# Patient Record
Sex: Female | Born: 1965 | Race: White | Hispanic: No | Marital: Married | Smoking: Never smoker
Health system: Southern US, Community
[De-identification: ages and names within clinical notes are randomized; demographics above are authoritative.]

## PROBLEM LIST (undated history)

## (undated) DIAGNOSIS — Z780 Asymptomatic menopausal state: Secondary | ICD-10-CM

## (undated) DIAGNOSIS — Z923 Personal history of irradiation: Secondary | ICD-10-CM

## (undated) DIAGNOSIS — Z9221 Personal history of antineoplastic chemotherapy: Secondary | ICD-10-CM

## (undated) DIAGNOSIS — Z95828 Presence of other vascular implants and grafts: Secondary | ICD-10-CM

## (undated) DIAGNOSIS — C50919 Malignant neoplasm of unspecified site of unspecified female breast: Secondary | ICD-10-CM

## (undated) DIAGNOSIS — I10 Essential (primary) hypertension: Secondary | ICD-10-CM

## (undated) DIAGNOSIS — G35 Multiple sclerosis: Secondary | ICD-10-CM

## (undated) HISTORY — DX: Asymptomatic menopausal state: Z78.0

## (undated) HISTORY — PX: BREAST BIOPSY: SHX20

---

## 1998-11-01 ENCOUNTER — Inpatient Hospital Stay (HOSPITAL_COMMUNITY): Admission: AD | Admit: 1998-11-01 | Discharge: 1998-11-01 | Payer: Self-pay | Admitting: *Deleted

## 1998-11-15 ENCOUNTER — Other Ambulatory Visit: Admission: RE | Admit: 1998-11-15 | Discharge: 1998-11-15 | Payer: Self-pay | Admitting: *Deleted

## 1999-03-11 ENCOUNTER — Ambulatory Visit (HOSPITAL_COMMUNITY): Admission: RE | Admit: 1999-03-11 | Discharge: 1999-03-11 | Payer: Self-pay | Admitting: Obstetrics and Gynecology

## 1999-03-11 ENCOUNTER — Encounter: Payer: Self-pay | Admitting: Obstetrics and Gynecology

## 1999-05-01 ENCOUNTER — Encounter: Payer: Self-pay | Admitting: Obstetrics and Gynecology

## 1999-05-01 ENCOUNTER — Inpatient Hospital Stay (HOSPITAL_COMMUNITY): Admission: AD | Admit: 1999-05-01 | Discharge: 1999-05-01 | Payer: Self-pay | Admitting: Obstetrics and Gynecology

## 1999-07-29 ENCOUNTER — Inpatient Hospital Stay (HOSPITAL_COMMUNITY): Admission: AD | Admit: 1999-07-29 | Discharge: 1999-07-31 | Payer: Self-pay | Admitting: *Deleted

## 1999-11-25 ENCOUNTER — Other Ambulatory Visit: Admission: RE | Admit: 1999-11-25 | Discharge: 1999-11-25 | Payer: Self-pay | Admitting: *Deleted

## 2005-02-05 ENCOUNTER — Ambulatory Visit: Payer: Self-pay | Admitting: Obstetrics and Gynecology

## 2006-04-16 ENCOUNTER — Ambulatory Visit: Payer: Self-pay | Admitting: Obstetrics and Gynecology

## 2006-11-17 ENCOUNTER — Emergency Department: Payer: Self-pay | Admitting: Emergency Medicine

## 2006-11-18 ENCOUNTER — Ambulatory Visit: Payer: Self-pay | Admitting: Unknown Physician Specialty

## 2007-01-31 ENCOUNTER — Emergency Department: Payer: Self-pay | Admitting: Emergency Medicine

## 2007-05-06 ENCOUNTER — Ambulatory Visit: Payer: Self-pay | Admitting: Obstetrics and Gynecology

## 2008-05-09 ENCOUNTER — Ambulatory Visit: Payer: Self-pay | Admitting: Obstetrics and Gynecology

## 2009-05-14 ENCOUNTER — Ambulatory Visit: Payer: Self-pay | Admitting: Obstetrics and Gynecology

## 2010-03-04 ENCOUNTER — Encounter: Payer: Self-pay | Admitting: Interventional Radiology

## 2010-05-31 ENCOUNTER — Ambulatory Visit: Payer: Self-pay | Admitting: Obstetrics and Gynecology

## 2011-06-12 DIAGNOSIS — G35 Multiple sclerosis: Secondary | ICD-10-CM | POA: Insufficient documentation

## 2011-06-12 DIAGNOSIS — I10 Essential (primary) hypertension: Secondary | ICD-10-CM | POA: Insufficient documentation

## 2011-06-13 DIAGNOSIS — M62838 Other muscle spasm: Secondary | ICD-10-CM | POA: Insufficient documentation

## 2011-06-18 ENCOUNTER — Ambulatory Visit: Payer: Self-pay | Admitting: Obstetrics and Gynecology

## 2011-09-11 DIAGNOSIS — R3915 Urgency of urination: Secondary | ICD-10-CM | POA: Insufficient documentation

## 2012-07-13 ENCOUNTER — Ambulatory Visit: Payer: Self-pay | Admitting: Obstetrics and Gynecology

## 2013-02-10 DIAGNOSIS — C50919 Malignant neoplasm of unspecified site of unspecified female breast: Secondary | ICD-10-CM

## 2013-02-10 HISTORY — DX: Malignant neoplasm of unspecified site of unspecified female breast: C50.919

## 2013-02-10 HISTORY — PX: BREAST LUMPECTOMY: SHX2

## 2013-02-17 DIAGNOSIS — R269 Unspecified abnormalities of gait and mobility: Secondary | ICD-10-CM | POA: Insufficient documentation

## 2013-09-15 ENCOUNTER — Ambulatory Visit: Payer: Self-pay | Admitting: Obstetrics and Gynecology

## 2013-09-23 ENCOUNTER — Ambulatory Visit: Payer: Self-pay | Admitting: Obstetrics and Gynecology

## 2013-09-28 ENCOUNTER — Ambulatory Visit: Payer: Self-pay | Admitting: Obstetrics and Gynecology

## 2013-10-04 LAB — PATHOLOGY REPORT

## 2013-10-11 ENCOUNTER — Ambulatory Visit: Payer: Self-pay | Admitting: Oncology

## 2013-10-18 ENCOUNTER — Ambulatory Visit: Payer: Self-pay | Admitting: Surgery

## 2013-10-18 LAB — CBC WITH DIFFERENTIAL/PLATELET
Basophil #: 0.1 10*3/uL (ref 0.0–0.1)
Basophil %: 1 %
Eosinophil #: 1 10*3/uL — ABNORMAL HIGH (ref 0.0–0.7)
Eosinophil %: 11 %
HCT: 38.3 % (ref 35.0–47.0)
HGB: 12.5 g/dL (ref 12.0–16.0)
Lymphocyte #: 2.2 10*3/uL (ref 1.0–3.6)
Lymphocyte %: 24.4 %
MCH: 28 pg (ref 26.0–34.0)
MCHC: 32.7 g/dL (ref 32.0–36.0)
MCV: 86 fL (ref 80–100)
Monocyte #: 0.6 x10 3/mm (ref 0.2–0.9)
Monocyte %: 7 %
Neutrophil #: 5.2 10*3/uL (ref 1.4–6.5)
Neutrophil %: 56.6 %
Platelet: 262 10*3/uL (ref 150–440)
RBC: 4.47 10*6/uL (ref 3.80–5.20)
RDW: 14.3 % (ref 11.5–14.5)
WBC: 9.1 10*3/uL (ref 3.6–11.0)

## 2013-10-21 ENCOUNTER — Ambulatory Visit: Payer: Self-pay | Admitting: Surgery

## 2013-10-21 LAB — HCG, QUANTITATIVE, PREGNANCY: Beta Hcg, Quant.: <1 m[IU]/mL — ABNORMAL LOW

## 2013-10-24 LAB — PATHOLOGY REPORT

## 2013-10-31 LAB — PREGNANCY, URINE: Pregnancy Test, Urine: NEGATIVE m[IU]/mL

## 2013-11-01 LAB — HCG, QUANTITATIVE, PREGNANCY: Beta Hcg, Quant.: <1 m[IU]/mL — ABNORMAL LOW

## 2013-11-10 ENCOUNTER — Ambulatory Visit: Payer: Self-pay | Admitting: Oncology

## 2013-11-11 ENCOUNTER — Ambulatory Visit: Payer: Self-pay | Admitting: Surgery

## 2013-11-11 LAB — HCG, QUANTITATIVE, PREGNANCY: Beta Hcg, Quant.: <1 m[IU]/mL — ABNORMAL LOW

## 2013-11-21 LAB — COMPREHENSIVE METABOLIC PANEL
ANION GAP: 10 (ref 7–16)
AST: 16 U/L (ref 15–37)
Albumin: 3.4 g/dL (ref 3.4–5.0)
Alkaline Phosphatase: 75 U/L
BILIRUBIN TOTAL: 0.2 mg/dL (ref 0.2–1.0)
BUN: 11 mg/dL (ref 7–18)
Calcium, Total: 8.5 mg/dL (ref 8.5–10.1)
Chloride: 103 mmol/L (ref 98–107)
Co2: 24 mmol/L (ref 21–32)
Creatinine: 0.94 mg/dL (ref 0.60–1.30)
EGFR (African American): >60
EGFR (Non-African Amer.): >60
Glucose: 146 mg/dL — ABNORMAL HIGH (ref 65–99)
Osmolality: 276 (ref 275–301)
Potassium: 3.5 mmol/L (ref 3.5–5.1)
SGPT (ALT): 18 U/L
SODIUM: 137 mmol/L (ref 136–145)
Total Protein: 6.7 g/dL (ref 6.4–8.2)

## 2013-11-21 LAB — CBC CANCER CENTER
Basophil #: 0.1 x10 3/mm (ref 0.0–0.1)
Basophil %: 1.2 %
Eosinophil #: 1.6 x10 3/mm — ABNORMAL HIGH (ref 0.0–0.7)
Eosinophil %: 15 %
HCT: 36.2 % (ref 35.0–47.0)
HGB: 12 g/dL (ref 12.0–16.0)
Lymphocyte #: 3 x10 3/mm (ref 1.0–3.6)
Lymphocyte %: 27.7 %
MCH: 27.9 pg (ref 26.0–34.0)
MCHC: 33.1 g/dL (ref 32.0–36.0)
MCV: 84 fL (ref 80–100)
Monocyte #: 0.8 x10 3/mm (ref 0.2–0.9)
Monocyte %: 7.3 %
Neutrophil #: 5.3 x10 3/mm (ref 1.4–6.5)
Neutrophil %: 48.8 %
Platelet: 266 x10 3/mm (ref 150–440)
RBC: 4.29 10*6/uL (ref 3.80–5.20)
RDW: 13.8 % (ref 11.5–14.5)
WBC: 10.9 x10 3/mm (ref 3.6–11.0)

## 2013-11-28 LAB — COMPREHENSIVE METABOLIC PANEL
ALBUMIN: 3.5 g/dL (ref 3.4–5.0)
ALK PHOS: 100 U/L
Anion Gap: 9 (ref 7–16)
BUN: 12 mg/dL (ref 7–18)
Bilirubin,Total: 0.2 mg/dL (ref 0.2–1.0)
CALCIUM: 9 mg/dL (ref 8.5–10.1)
CO2: 27 mmol/L (ref 21–32)
Chloride: 102 mmol/L (ref 98–107)
Creatinine: 0.71 mg/dL (ref 0.60–1.30)
EGFR (African American): >60
EGFR (Non-African Amer.): >60
GLUCOSE: 98 mg/dL (ref 65–99)
Osmolality: 275 (ref 275–301)
Potassium: 3.8 mmol/L (ref 3.5–5.1)
SGOT(AST): 14 U/L — ABNORMAL LOW (ref 15–37)
SGPT (ALT): 18 U/L
SODIUM: 138 mmol/L (ref 136–145)
Total Protein: 7.3 g/dL (ref 6.4–8.2)

## 2013-11-28 LAB — CBC CANCER CENTER
Basophil #: 0.1 x10 3/mm (ref 0.0–0.1)
Basophil %: 1.8 %
EOS PCT: 10.8 %
Eosinophil #: 0.4 x10 3/mm (ref 0.0–0.7)
HCT: 36.3 % (ref 35.0–47.0)
HGB: 11.8 g/dL — AB (ref 12.0–16.0)
LYMPHS PCT: 32.5 %
Lymphocyte #: 1.3 x10 3/mm (ref 1.0–3.6)
MCH: 27.3 pg (ref 26.0–34.0)
MCHC: 32.4 g/dL (ref 32.0–36.0)
MCV: 85 fL (ref 80–100)
MONOS PCT: 3.6 %
Monocyte #: 0.1 x10 3/mm — ABNORMAL LOW (ref 0.2–0.9)
NEUTROS PCT: 51.3 %
Neutrophil #: 2 x10 3/mm (ref 1.4–6.5)
Platelet: 235 x10 3/mm (ref 150–440)
RBC: 4.3 10*6/uL (ref 3.80–5.20)
RDW: 14 % (ref 11.5–14.5)
WBC: 4 x10 3/mm (ref 3.6–11.0)

## 2013-12-05 LAB — COMPREHENSIVE METABOLIC PANEL
ALK PHOS: 115 U/L
ALT: 33 U/L
Albumin: 3.9 g/dL (ref 3.4–5.0)
Anion Gap: 10 (ref 7–16)
BUN: 13 mg/dL (ref 7–18)
Bilirubin,Total: 0.2 mg/dL (ref 0.2–1.0)
CALCIUM: 8.8 mg/dL (ref 8.5–10.1)
CO2: 26 mmol/L (ref 21–32)
Chloride: 101 mmol/L (ref 98–107)
Creatinine: 0.96 mg/dL (ref 0.60–1.30)
EGFR (African American): >60
GLUCOSE: 120 mg/dL — AB (ref 65–99)
Osmolality: 275 (ref 275–301)
POTASSIUM: 4 mmol/L (ref 3.5–5.1)
SGOT(AST): 27 U/L (ref 15–37)
Sodium: 137 mmol/L (ref 136–145)
TOTAL PROTEIN: 7.4 g/dL (ref 6.4–8.2)

## 2013-12-05 LAB — CBC CANCER CENTER
Basophil #: 0.2 x10 3/mm — ABNORMAL HIGH (ref 0.0–0.1)
Basophil %: 1 %
EOS ABS: 0.3 x10 3/mm (ref 0.0–0.7)
EOS PCT: 2.1 %
HCT: 36.2 % (ref 35.0–47.0)
HGB: 11.9 g/dL — ABNORMAL LOW (ref 12.0–16.0)
LYMPHS ABS: 2.2 x10 3/mm (ref 1.0–3.6)
LYMPHS PCT: 13.8 %
MCH: 27.6 pg (ref 26.0–34.0)
MCHC: 32.9 g/dL (ref 32.0–36.0)
MCV: 84 fL (ref 80–100)
Monocyte #: 1.4 x10 3/mm — ABNORMAL HIGH (ref 0.2–0.9)
Monocyte %: 8.4 %
Neutrophil #: 12.2 x10 3/mm — ABNORMAL HIGH (ref 1.4–6.5)
Neutrophil %: 74.7 %
Platelet: 288 x10 3/mm (ref 150–440)
RBC: 4.3 10*6/uL (ref 3.80–5.20)
RDW: 14.1 % (ref 11.5–14.5)
WBC: 16.3 x10 3/mm — ABNORMAL HIGH (ref 3.6–11.0)

## 2013-12-11 ENCOUNTER — Ambulatory Visit: Payer: Self-pay | Admitting: Oncology

## 2013-12-19 LAB — CBC CANCER CENTER
Basophil #: 0.2 x10 3/mm — ABNORMAL HIGH (ref 0.0–0.1)
Basophil %: 1.3 %
EOS PCT: 2.4 %
Eosinophil #: 0.4 x10 3/mm (ref 0.0–0.7)
HCT: 35.3 % (ref 35.0–47.0)
HGB: 11.6 g/dL — ABNORMAL LOW (ref 12.0–16.0)
LYMPHS PCT: 13.4 %
Lymphocyte #: 2.1 x10 3/mm (ref 1.0–3.6)
MCH: 27.9 pg (ref 26.0–34.0)
MCHC: 32.9 g/dL (ref 32.0–36.0)
MCV: 85 fL (ref 80–100)
MONO ABS: 1.6 x10 3/mm — AB (ref 0.2–0.9)
MONOS PCT: 9.8 %
NEUTROS ABS: 11.7 x10 3/mm — AB (ref 1.4–6.5)
Neutrophil %: 73.1 %
Platelet: 216 x10 3/mm (ref 150–440)
RBC: 4.16 10*6/uL (ref 3.80–5.20)
RDW: 14.4 % (ref 11.5–14.5)
WBC: 16 x10 3/mm — AB (ref 3.6–11.0)

## 2013-12-19 LAB — COMPREHENSIVE METABOLIC PANEL
ALT: 22 U/L
ANION GAP: 12 (ref 7–16)
Albumin: 3.8 g/dL (ref 3.4–5.0)
Alkaline Phosphatase: 102 U/L
BUN: 9 mg/dL (ref 7–18)
Bilirubin,Total: 0.2 mg/dL (ref 0.2–1.0)
CREATININE: 0.97 mg/dL (ref 0.60–1.30)
Calcium, Total: 8.9 mg/dL (ref 8.5–10.1)
Chloride: 102 mmol/L (ref 98–107)
Co2: 26 mmol/L (ref 21–32)
EGFR (Non-African Amer.): >60
Glucose: 105 mg/dL — ABNORMAL HIGH (ref 65–99)
OSMOLALITY: 278 (ref 275–301)
Potassium: 3.8 mmol/L (ref 3.5–5.1)
SGOT(AST): 17 U/L (ref 15–37)
Sodium: 140 mmol/L (ref 136–145)
TOTAL PROTEIN: 7.2 g/dL (ref 6.4–8.2)

## 2014-01-02 LAB — COMPREHENSIVE METABOLIC PANEL
ALT: 32 U/L
Albumin: 3.6 g/dL (ref 3.4–5.0)
Alkaline Phosphatase: 113 U/L
Anion Gap: 12 (ref 7–16)
BUN: 12 mg/dL (ref 7–18)
Bilirubin,Total: 0.2 mg/dL (ref 0.2–1.0)
CO2: 25 mmol/L (ref 21–32)
Calcium, Total: 8.7 mg/dL (ref 8.5–10.1)
Chloride: 101 mmol/L (ref 98–107)
Creatinine: 0.85 mg/dL (ref 0.60–1.30)
Glucose: 134 mg/dL — ABNORMAL HIGH (ref 65–99)
Osmolality: 277 (ref 275–301)
Potassium: 3.3 mmol/L — ABNORMAL LOW (ref 3.5–5.1)
SGOT(AST): 24 U/L (ref 15–37)
Sodium: 138 mmol/L (ref 136–145)
Total Protein: 6.8 g/dL (ref 6.4–8.2)

## 2014-01-02 LAB — CBC CANCER CENTER
Bands: 6 %
Basophil: 1 %
EOS PCT: 1 %
HCT: 33.2 % — ABNORMAL LOW (ref 35.0–47.0)
HGB: 10.8 g/dL — ABNORMAL LOW (ref 12.0–16.0)
Lymphocytes: 9 %
MCH: 27.9 pg (ref 26.0–34.0)
MCHC: 32.6 g/dL (ref 32.0–36.0)
MCV: 86 fL (ref 80–100)
MONOS PCT: 8 %
Metamyelocyte: 4 %
Myelocyte: 6 %
NRBC/100 WBC: 5 /100
Platelet: 253 x10 3/mm (ref 150–440)
RBC: 3.88 10*6/uL (ref 3.80–5.20)
RDW: 16.7 % — ABNORMAL HIGH (ref 11.5–14.5)
Segmented Neutrophils: 65 %
WBC: 14.2 x10 3/mm — ABNORMAL HIGH (ref 3.6–11.0)

## 2014-01-10 ENCOUNTER — Ambulatory Visit: Payer: Self-pay | Admitting: Oncology

## 2014-01-16 LAB — CBC CANCER CENTER
Basophil #: 0.1 x10 3/mm (ref 0.0–0.1)
Basophil %: 0.8 %
EOS ABS: 0.1 x10 3/mm (ref 0.0–0.7)
Eosinophil %: 0.6 %
HCT: 30.5 % — ABNORMAL LOW (ref 35.0–47.0)
HGB: 10.3 g/dL — ABNORMAL LOW (ref 12.0–16.0)
LYMPHS ABS: 1.4 x10 3/mm (ref 1.0–3.6)
Lymphocyte %: 10.4 %
MCH: 28.7 pg (ref 26.0–34.0)
MCHC: 33.7 g/dL (ref 32.0–36.0)
MCV: 85 fL (ref 80–100)
MONO ABS: 1.3 x10 3/mm — AB (ref 0.2–0.9)
MONOS PCT: 9.3 %
NEUTROS ABS: 10.8 x10 3/mm — AB (ref 1.4–6.5)
Neutrophil %: 78.9 %
PLATELETS: 190 x10 3/mm (ref 150–440)
RBC: 3.58 10*6/uL — ABNORMAL LOW (ref 3.80–5.20)
RDW: 20.5 % — AB (ref 11.5–14.5)
WBC: 13.7 x10 3/mm — AB (ref 3.6–11.0)

## 2014-01-16 LAB — COMPREHENSIVE METABOLIC PANEL
ALK PHOS: 110 U/L
ANION GAP: 11 (ref 7–16)
Albumin: 3.6 g/dL (ref 3.4–5.0)
BUN: 10 mg/dL (ref 7–18)
Bilirubin,Total: 0.3 mg/dL (ref 0.2–1.0)
CALCIUM: 8.8 mg/dL (ref 8.5–10.1)
CHLORIDE: 102 mmol/L (ref 98–107)
Co2: 25 mmol/L (ref 21–32)
Creatinine: 0.86 mg/dL (ref 0.60–1.30)
EGFR (Non-African Amer.): >60
Glucose: 111 mg/dL — ABNORMAL HIGH (ref 65–99)
Osmolality: 275 (ref 275–301)
POTASSIUM: 3.3 mmol/L — AB (ref 3.5–5.1)
SGOT(AST): 14 U/L — ABNORMAL LOW (ref 15–37)
SGPT (ALT): 17 U/L
SODIUM: 138 mmol/L (ref 136–145)
Total Protein: 7.1 g/dL (ref 6.4–8.2)

## 2014-01-23 LAB — COMPREHENSIVE METABOLIC PANEL
ALK PHOS: 90 U/L
ALT: 18 U/L
ANION GAP: 9 (ref 7–16)
Albumin: 3.5 g/dL (ref 3.4–5.0)
BUN: 9 mg/dL (ref 7–18)
Bilirubin,Total: 0.3 mg/dL (ref 0.2–1.0)
CREATININE: 0.8 mg/dL (ref 0.60–1.30)
Calcium, Total: 8.8 mg/dL (ref 8.5–10.1)
Chloride: 104 mmol/L (ref 98–107)
Co2: 25 mmol/L (ref 21–32)
EGFR (African American): >60
EGFR (Non-African Amer.): >60
Glucose: 97 mg/dL (ref 65–99)
Osmolality: 274 (ref 275–301)
Potassium: 3.5 mmol/L (ref 3.5–5.1)
SGOT(AST): 13 U/L — ABNORMAL LOW (ref 15–37)
Sodium: 138 mmol/L (ref 136–145)
TOTAL PROTEIN: 7 g/dL (ref 6.4–8.2)

## 2014-01-23 LAB — CBC CANCER CENTER
BASOS PCT: 1.3 %
Basophil #: 0.1 x10 3/mm (ref 0.0–0.1)
EOS ABS: 0.1 x10 3/mm (ref 0.0–0.7)
Eosinophil %: 1.5 %
HCT: 29 % — ABNORMAL LOW (ref 35.0–47.0)
HGB: 9.7 g/dL — ABNORMAL LOW (ref 12.0–16.0)
LYMPHS ABS: 0.8 x10 3/mm — AB (ref 1.0–3.6)
Lymphocyte %: 9.8 %
MCH: 29.6 pg (ref 26.0–34.0)
MCHC: 33.4 g/dL (ref 32.0–36.0)
MCV: 89 fL (ref 80–100)
Monocyte #: 0.8 x10 3/mm (ref 0.2–0.9)
Monocyte %: 10.1 %
NEUTROS PCT: 77.3 %
Neutrophil #: 6.2 x10 3/mm (ref 1.4–6.5)
PLATELETS: 300 x10 3/mm (ref 150–440)
RBC: 3.27 10*6/uL — AB (ref 3.80–5.20)
RDW: 22.1 % — ABNORMAL HIGH (ref 11.5–14.5)
WBC: 8 x10 3/mm (ref 3.6–11.0)

## 2014-01-30 LAB — CBC CANCER CENTER
BASOS ABS: 0.2 x10 3/mm — AB (ref 0.0–0.1)
BASOS PCT: 2.6 %
EOS ABS: 0.3 x10 3/mm (ref 0.0–0.7)
Eosinophil %: 4.7 %
HCT: 31.5 % — AB (ref 35.0–47.0)
HGB: 10.7 g/dL — AB (ref 12.0–16.0)
LYMPHS ABS: 0.8 x10 3/mm — AB (ref 1.0–3.6)
LYMPHS PCT: 11.6 %
MCH: 29.7 pg (ref 26.0–34.0)
MCHC: 33.8 g/dL (ref 32.0–36.0)
MCV: 88 fL (ref 80–100)
Monocyte #: 0.7 x10 3/mm (ref 0.2–0.9)
Monocyte %: 9.9 %
NEUTROS PCT: 71.2 %
Neutrophil #: 5.2 x10 3/mm (ref 1.4–6.5)
Platelet: 316 x10 3/mm (ref 150–440)
RBC: 3.59 10*6/uL — ABNORMAL LOW (ref 3.80–5.20)
RDW: 22.2 % — ABNORMAL HIGH (ref 11.5–14.5)
WBC: 7.3 x10 3/mm (ref 3.6–11.0)

## 2014-01-30 LAB — COMPREHENSIVE METABOLIC PANEL
ALBUMIN: 3.6 g/dL (ref 3.4–5.0)
ALK PHOS: 109 U/L
ALT: 21 U/L
AST: 13 U/L — AB (ref 15–37)
Anion Gap: 9 (ref 7–16)
BUN: 12 mg/dL (ref 7–18)
Bilirubin,Total: 0.4 mg/dL (ref 0.2–1.0)
CHLORIDE: 102 mmol/L (ref 98–107)
CO2: 27 mmol/L (ref 21–32)
Calcium, Total: 8.6 mg/dL (ref 8.5–10.1)
Creatinine: 0.88 mg/dL (ref 0.60–1.30)
EGFR (Non-African Amer.): >60
GLUCOSE: 117 mg/dL — AB (ref 65–99)
Osmolality: 276 (ref 275–301)
POTASSIUM: 3.6 mmol/L (ref 3.5–5.1)
SODIUM: 138 mmol/L (ref 136–145)
TOTAL PROTEIN: 7.1 g/dL (ref 6.4–8.2)

## 2014-02-10 ENCOUNTER — Ambulatory Visit: Payer: Self-pay | Admitting: Oncology

## 2014-02-13 LAB — CBC CANCER CENTER
Basophil #: 0.1 x10 3/mm (ref 0.0–0.1)
Basophil %: 0.8 %
EOS ABS: 0.3 x10 3/mm (ref 0.0–0.7)
EOS PCT: 3.2 %
HCT: 33.9 % — ABNORMAL LOW (ref 35.0–47.0)
HGB: 11.4 g/dL — ABNORMAL LOW (ref 12.0–16.0)
Lymphocyte #: 0.8 x10 3/mm — ABNORMAL LOW (ref 1.0–3.6)
Lymphocyte %: 10.3 %
MCH: 30.2 pg (ref 26.0–34.0)
MCHC: 33.7 g/dL (ref 32.0–36.0)
MCV: 90 fL (ref 80–100)
MONOS PCT: 12.2 %
Monocyte #: 1 x10 3/mm — ABNORMAL HIGH (ref 0.2–0.9)
NEUTROS PCT: 73.5 %
Neutrophil #: 6 x10 3/mm (ref 1.4–6.5)
PLATELETS: 320 x10 3/mm (ref 150–440)
RBC: 3.78 10*6/uL — AB (ref 3.80–5.20)
RDW: 20.1 % — ABNORMAL HIGH (ref 11.5–14.5)
WBC: 8.1 x10 3/mm (ref 3.6–11.0)

## 2014-02-13 LAB — COMPREHENSIVE METABOLIC PANEL
ALBUMIN: 3.4 g/dL (ref 3.4–5.0)
ALK PHOS: 130 U/L — AB
ALT: 24 U/L
AST: 24 U/L (ref 15–37)
Anion Gap: 9 (ref 7–16)
BUN: 9 mg/dL (ref 7–18)
Bilirubin,Total: 0.4 mg/dL (ref 0.2–1.0)
CALCIUM: 8.8 mg/dL (ref 8.5–10.1)
Chloride: 102 mmol/L (ref 98–107)
Co2: 27 mmol/L (ref 21–32)
Creatinine: 0.73 mg/dL (ref 0.60–1.30)
EGFR (African American): >60
EGFR (Non-African Amer.): >60
GLUCOSE: 133 mg/dL — AB (ref 65–99)
Osmolality: 276 (ref 275–301)
Potassium: 3.3 mmol/L — ABNORMAL LOW (ref 3.5–5.1)
Sodium: 138 mmol/L (ref 136–145)
TOTAL PROTEIN: 7.2 g/dL (ref 6.4–8.2)

## 2014-02-16 DIAGNOSIS — C50919 Malignant neoplasm of unspecified site of unspecified female breast: Secondary | ICD-10-CM | POA: Insufficient documentation

## 2014-02-16 DIAGNOSIS — C50412 Malignant neoplasm of upper-outer quadrant of left female breast: Secondary | ICD-10-CM | POA: Insufficient documentation

## 2014-03-13 ENCOUNTER — Ambulatory Visit: Payer: Self-pay | Admitting: Oncology

## 2014-04-11 ENCOUNTER — Ambulatory Visit
Admit: 2014-04-11 | Disposition: A | Discharge status: Home / Self Care | Payer: Self-pay | Attending: Oncology | Admitting: Oncology

## 2014-05-12 ENCOUNTER — Ambulatory Visit
Admit: 2014-05-12 | Disposition: A | Discharge status: Home / Self Care | Payer: Self-pay | Attending: Oncology | Admitting: Oncology

## 2014-06-03 NOTE — Op Note (Signed)
PATIENT NAME:  Rachel Hall, Rachel Hall MR#:  [id number] DATE OF BIRTH:  Jul 15, 1965  DATE OF PROCEDURE:  11/11/2013  PREOPERATIVE DIAGNOSIS: Breast cancer.   POSTOPERATIVE DIAGNOSIS: Breast cancer.   PROCEDURE PERFORMED: Insertion central venous catheter with subcutaneous infusion port.   SURGEON:  Loreli Dollar, MD   ANESTHESIA: Monitored anesthesia care and local 1% Xylocaine.   INDICATIONS: This 49 year old female recently had findings of left breast cancer, now needing central venous access for chemotherapy.   DESCRIPTION OF PROCEDURE: The patient was placed on the operating table in the supine position under intravenous sedation and monitored by the anesthesia staff. A rolled sheet was placed behind the shoulder blades so that the neck was extended and the head turned 20 degrees to the left. The anterior aspect of her neck and the right subclavian areas were prepared with ChloraPrep and draped in a sterile manner.   The skin beneath the clavicle was infiltrated with 1% Xylocaine. A transversely oriented 3 cm incision was made, carried down through subcutaneous tissues. A subcutaneous pouch was created inferior to the incision large enough to admit the [place] port. Several small bleeding points were cauterized.   The jugular vein was identified with ultrasound, also identified the carotid artery. The skin overlying the jugular vein was infiltrated with 1% Xylocaine. A transversely oriented 5 mm incision was made.  The needle was inserted into the jugular vein using ultrasound guidance and ultrasound image was saved for the paper chart. The guidewire was advanced down through the needle into the central circulation, and the needle removed. Next, fluoroscopy was used to demonstrate the position of the guidewire in the vena cava. Next, the dilator and introducer sheath were advanced over the guidewire. The guidewire and dilator were removed. The catheter was advanced down through the sheath, and the  sheath was peeled away. The tip of the catheter was positioned in the superior vena cava as demonstrated with fluoroscopy. A fluoroscopic image was saved for the paper chart. The catheter was tunneled to the subclavian incision. It was cut to fit and attached to the [place] port. This was secured with the accompanying sleeve. Next, the port was placed into the subcutaneous pouch and sutured to the deep fascia with 4-0 silk. The port was accessed with a Huber needle and aspirated a trace of blood and flushed with heparinized saline solution.   Next, the subcutaneous tissues were closed with 5-0 Vicryl. Skin incisions were closed with 5-0 Vicryl subcuticular suture and Dermabond. The patient tolerated surgery satisfactorily and was prepared for transfer to the recovery room.   ____________________________ Lenna Sciara. Rochel Brome, MD jws:LT D: 11/22/2013 15:17:04 ET T: 11/22/2013 16:32:59 ET JOB#: [id number]  cc: Loreli Dollar, MD, <Dictator> Loreli Dollar MD ELECTRONICALLY SIGNED 11/22/2013 18:57

## 2014-06-03 NOTE — Op Note (Signed)
PATIENT NAME:  Rachel Hall, Rachel Hall MR#:  [id number] DATE OF BIRTH:  22-Apr-1965  DATE OF PROCEDURE:  10/21/2013  PREOPERATIVE DIAGNOSIS: Carcinoma of the left breast.   POSTOPERATIVE DIAGNOSIS: Carcinoma of the left breast.   PROCEDURE: Left partial mastectomy with sentinel lymph node biopsy.   SURGEON: Rochel Brome, MD.   ANESTHESIA: General.   INDICATIONS: This 49 year old female recently had a mammogram depicting a distortion in the upper outer quadrant of the left breast. Ultrasound demonstrated a 1.2 cm irregular mass. Biopsy demonstrated invasive mammary carcinoma. She did have a palpable mass at the 2 o'clock position of the left breast. The patient did have preoperative injection of radioactive technetium sulfur colloid.   DESCRIPTION OF PROCEDURE: The patient was placed on the operating table in the supine position under general endotracheal anesthesia. The left arm was placed on a lateral arm support. The left breast, axilla, upper arm and chest wall were prepared with ChloraPrep and draped in a sterile manner. Ultrasound was used to demonstrate the location of an approximately 1 cm mass at approximately the 2 o'clock position of the left breast, some 3.5 cm from the nipple. There was also palpable firmness at this site. A curvilinear incision was made from the 11:30 position to the 3:30 position. I did remove an ellipse of skin, which was approximately 1 cm wide directly over the mass. The dissection was carried down through subcutaneous tissues, down to palpate the mass and dissection was continued as palpation helped to direct the course of the dissection. The mass was oriented so that the 3:30 end of the skin ellipse was tagged with a 3-0 nylon stitch. Also, margin maps were used to suture to the specimen to mark the mediolateral, craniocaudal and deep margins. This was submitted for pathology. The wound was inspected. Several small bleeding points were cauterized. Hemostasis subsequently  appeared to be intact. There was no remaining mass seen within the wound.   Next, attention was turned to the axilla. The gamma counter was used to demonstrate the location of radioactivity in the inferior aspect of the left axilla. An obliquely oriented 4 cm incision was made, which was lengthened to nearly 5 cm and carried down through subcutaneous tissues, through superficial fascia deeply within the axilla and encountered an area of radioactivity. A portion of fatty tissue containing a lymph node was dissected free from surrounding structures using electrocautery. The ex vivo count was in the range of 40 to 55 counts per second. This was submitted as sentinel lymph node #1. Background count was minimal. There were 2 palpable lymph nodes within the axilla. There was a minimal degree of radioactivity at these sites, but they were firm, approximately 5 mm and were resected with some surrounding fatty tissue. The ex vivo counts were minimal. This was submitted as additional axillary lymph nodes. There was no other palpable mass within the axilla. It is noted that during the course of the axillary dissection, 1 vessel was suture ligated with 4-0 chromic. Hemostasis otherwise appeared to be intact. The pathologist did call back to say that the margins appeared to be clear with the left partial mastectomy specimen, with the pathologist having found the tumor and seen that the closest margin was deep margin and was clearly free of tumor and has been submitted for routine studies.   The axillary wound was further inspected. Hemostasis was intact. Subcutaneous tissues were approximated with 4-0 chromic. Both wounds were treated with subcutaneous 0.5% Sensorcaine with epinephrine. Next, the skin of  the partial mastectomy wound was approximated with running 4-0 Monocryl subcuticular suture. The axillary wound was further inspected and subcutaneous tissues were approximated with 4-0 chromic. The skin was closed with  running 4-0 Monocryl subcuticular suture. Both wounds were treated with Dermabond. The patient tolerated surgery satisfactorily and was then prepared for transfer to the recovery room.     ____________________________ Lenna Sciara. Rochel Brome, MD jws:TT D: 10/21/2013 14:46:36 ET T: 10/21/2013 15:06:25 ET JOB#: [id number]  cc: Loreli Dollar, MD, <Dictator> Loreli Dollar MD ELECTRONICALLY SIGNED 10/21/2013 17:27

## 2014-06-11 NOTE — Consult Note (Signed)
Reason for Visit: This 49 year old Female patient presents to the clinic for initial evaluation of  reast cancer .   Referred by Dr. Grayland Ormond.  Diagnosis:  Chief Complaint/Diagnosis   49 year old female with multiple sclerosis stage IIa (T1c and 18 M0) invasive mammary carcinoma of the left breast status post wide local excision adjuvant chemotherapy with Cytoxan Adriamycin and 4 cycles of Taxol now for left breast and perform hepatic radiation.  Pathology Report pathology report reviewed   Imaging Report mammograms reviewed   Referral Report clinical notes reviewed   Planned Treatment Regimen adjuvant whole breast and peripheral lymphatic radiation   HPI   patient is a 49 year old female with known multiple sclerosis who presented with an abnormal mammogram of her left breast showing a mass at the 1:00 position measuring approximately 1.1 x 1.2 cm in the upper outer quadrant of the left breast suspicious for malignancy. On physical examination a firm palpable mass at that location was identified. She underwent biopsy which was positive for invasive mammary carcinoma.Jodelle Red wide local excision and sentinel node biopsy. Tumor was grade 1 overall 1.6 cm invasive mammary carcinoma.margins were clear but close to 1.3 mm for the invasive component and 2 mm for DCIS component. 4 lymph nodes were removed one had a macro metastatic focus of metastatic disease. Tumor was ER/PR positive HER-2/neu negative. She underwent 4 cycles of Cytoxan H Romycin. She was started on weekly Taxol after 4 cycles had worsening of her symptoms of multiple sclerosis it was decided to stop any further chemotherapy and go to radiation at this point. She still fairly weak and fatigued. She is wheelchair bound at this point. She specifically denies any breast tenderness cough or bone pain.  Past Hx:    multiple sclerosis:   Past, Family and Social History:  Past Medical History positive   Cardiovascular  hyperlipidemia; hypertension   Neurological/Psychiatric multiple sclerosis   Past Medical History Comments chronic pain syndrome   Family History noncontributory   Social History noncontributory   Additional Past Medical and Surgical History accompanied by her mom today   Allergies:   No Known Allergies:   Home Meds:  Home Medications: Medication Instructions Status  ALPRAZolam 0.25 mg oral tablet 1 tab(s) orally once a day (at bedtime) Active  Compazine tablet 10 mg 1 tab(s) orally every 6 hours x 30 days as needed   Active  Tylenol Extra Strength 1 cap(s) orally every 4 to 6 hours, As Needed - for Pain Active  aspirin 81 mg oral tablet 1 tab(s) orally once a day Active  Norco 325 mg-5 mg oral tablet 1-2 tablets orally every 4 hours  -for Pain as needed Active  medicated mouthwash 5 milliliter(s)  4 times a day swish and swallow Active  MiraLax - oral powder for reconstitution 2 -3 capfuls dose(s) orally once a day Active  FLUoxetine 20 mg oral capsule 3 cap(s) orally once a day Active  amLODIPine 5 mg oral tablet 1 tab(s) orally once a day Active  omeprazole 20 mg oral delayed release capsule 1 cap(s) orally once a day Active  oxybutynin 5 mg oral tablet 1 tab(s) orally 2 times a day Active  diphenhydrAMINE 25 mg oral capsule 1 cap(s) orally once a day Active  Melatonin 1 mg oral tablet 1 tab(s) orally once a day (at bedtime) Active  Fleet Glycerin Suppositories Adult adult rectal suppository 1 suppository(ies) rectal once a day, As Needed Active   Review of Systems:  General negative   Performance  Status (ECOG) 1   Skin negative   Breast see HPI   Ophthalmologic negative   ENMT negative   Respiratory and Thorax negative   Cardiovascular negative   Gastrointestinal negative   Genitourinary negative   Musculoskeletal negative   Neurological see HPI   Psychiatric negative   Hematology/Lymphatics negative   Endocrine negative   Allergic/Immunologic  negative   Nursing Notes:  Nursing Vital Signs and Chemo Nursing Nursing Notes: *CC Vital Signs Flowsheet:   21-Jan-16 10:01  Temp Temperature 97.7  Pulse Pulse 109  Respirations Respirations 18  SBP SBP 130  DBP DBP 77  Pain Scale (0-10)  0  Height (cm) centimeters 156    10:56  Height (cm) centimeters 156   Physical Exam:  General/Skin/HEENT:  Skin normal   Eyes normal   ENMT normal   Head and Neck normal   Additional PE well-developed female wheelchair-bound in NAD. Status post wide local excision of the left breast. No dominant mass or nodularity is noted in either breast in 2 positions examined. Lungs are clear to A&P cardiac examination shows regular rate and rhythm. Abdomen is benign. No evidence of lymphedema in her left upper extremity is noted. She has obvious sequela of multiple sclerosis.   Breasts/Resp/CV/GI/GU:  Respiratory and Thorax normal   Cardiovascular normal   Gastrointestinal normal   Genitourinary normal   MS/Neuro/Psych/Lymph:  Musculoskeletal normal   Neurological normal   Lymphatics normal   Other Results:  Radiology Results: LabUnknown:    14-Aug-15 12:11, Digital Additional Views Lt Breast [facility])  PACS Image   [facility]:  Digital Additional Views Lt Breast (SCR)   REASON FOR EXAM:    LT BREAST AV FOR DISTORTION  COMMENTS:       PROCEDURE: MAM - MAM DGTL ADD VW LT  SCR  - Sep 23 2013 12:11PM     CLINICAL DATA:  49 year old female with possible left breast  distortion on screening mammogram.    EXAM:  DIGITAL DIAGNOSTIC  LEFT MAMMOGRAM    ULTRASOUND LEFT BREAST    COMPARISON:  09/15/2013 and prior mammograms dating back to  02/05/2005  ACR Breast Density Category c: The breast tissue is heterogeneously  dense, which may obscure small masses.    FINDINGS:  Spot compression views of the left breast demonstrate an area of  persistent distortion within the upper outer left breast.    On physical exam, a firm palpable  mass identified at the 1 o'clock  position of the left breast 3 cm from the nipple.    Ultrasound is performed, showing a 1.1 x 1.2 x 0.9 cm irregular  hypoechoic mass at the 1 o'clock position of the left breast 3 cm  from the nipple. No enlarged or abnormal appearing left axillary  lymph nodes are identified.   IMPRESSION:  1.1 x 1.2 x 0.9cm irregular mass in the upper outer left breast  suspicious for malignancy. Tissue sampling is recommended.    No abnormal appearing left axillary lymph nodes identified.    RECOMMENDATION:  Ultrasound-guided left breast biopsy.    I have discussed the findings and recommendations with the patient.  Results were also provided in writing at the conclusion of the  visit. If applicable, a reminder letter will be sent to the patient  regarding the next appointment.    BI-RADS CATEGORY  4: Suspicious.  Electronically Signed    By: Hassan Rowan M.D.    On: 09/23/2013 12:35  Verified By: Lura Em, M.D.,   Relevent Results:   Relevant Scans and Labs mammograms are reviewed   Assessment and Plan: Impression:   stage II a invasive mammary carcinoma left breast status post wide local excision and sentinel node biopsy with adjuvant chemotherapy in patient with known multiple sclerosis. Plan:   at this time I have recommended whole breast radiation. Also recommended peripheral lymphatic radiation based on the limited lymph node dissection in 1 of 4 lymph nodes being positive for macro metastatic disease. I would plan on delivering 5000 cGy to both her breast and peripheral lymphatics boosting her scar another 1600 cGy based on the close margins. Risks and benefits of treatment including skin reaction, fatigue, alteration of blood counts, occlusion of superficial lung, and possibility of lymphedema of her left upper extremity all were discussed in detail. Even though she's had a limited lymph node dissection I have recommended her doing physical  activity with her left arm to try to prevent any lymphedema in her left upper extremity. I have set up and ordered CT simulation on the patient for early next week. She will also be a candidate for aromatase inhibitor therapy after completion of radiation.  I would like to take this opportunity for allowing me to participate in the care of your patient..  Fax to Physician:  Physicians To Recieve Fax: Kirk Ruths, MD - [phone] Rochel Brome, MD - [phone].  Electronic Signatures: Armstead Peaks (MD)  (Signed 21-Jan-16 12:23)  Authored: HPI, Diagnosis, Past Hx, PFSH, Allergies, Home Meds, ROS, Nursing Notes, Physical Exam, Other Results, Relevent Results, Encounter Assessment and Plan, Fax to Physician   Last Updated: 21-Jan-16 12:23 by Armstead Peaks (MD)

## 2014-06-23 ENCOUNTER — Encounter (INDEPENDENT_AMBULATORY_CARE_PROVIDER_SITE_OTHER): Payer: Self-pay

## 2014-06-23 ENCOUNTER — Inpatient Hospital Stay: Payer: BC Managed Care – PPO | Attending: Oncology

## 2014-06-23 DIAGNOSIS — C50912 Malignant neoplasm of unspecified site of left female breast: Secondary | ICD-10-CM | POA: Diagnosis not present

## 2014-06-23 DIAGNOSIS — Z170 Estrogen receptor positive status [ER+]: Secondary | ICD-10-CM | POA: Diagnosis not present

## 2014-06-23 DIAGNOSIS — C510 Malignant neoplasm of labium majus: Secondary | ICD-10-CM | POA: Insufficient documentation

## 2014-06-23 DIAGNOSIS — Z452 Encounter for adjustment and management of vascular access device: Secondary | ICD-10-CM | POA: Diagnosis not present

## 2014-06-23 DIAGNOSIS — C801 Malignant (primary) neoplasm, unspecified: Secondary | ICD-10-CM

## 2014-06-23 MED ORDER — SODIUM CHLORIDE 0.9 % IJ SOLN
10.0000 mL | INTRAMUSCULAR | Status: AC | PRN
  Administered 2014-06-23: 10 mL via INTRAVENOUS
  Filled 2014-06-23: qty 10

## 2014-06-23 MED ORDER — HEPARIN SOD (PORK) LOCK FLUSH 100 UNIT/ML IV SOLN
500.0000 [IU] | Freq: Once | INTRAVENOUS | Status: AC
  Administered 2014-06-23: 500 [IU] via INTRAVENOUS
  Filled 2014-06-23: qty 5

## 2014-07-25 ENCOUNTER — Ambulatory Visit: Payer: Self-pay | Admitting: Podiatry

## 2014-08-10 ENCOUNTER — Other Ambulatory Visit: Payer: Self-pay

## 2014-08-10 ENCOUNTER — Ambulatory Visit: Payer: Self-pay | Admitting: Oncology

## 2014-08-24 ENCOUNTER — Inpatient Hospital Stay (HOSPITAL_BASED_OUTPATIENT_CLINIC_OR_DEPARTMENT_OTHER): Payer: BC Managed Care – PPO | Admitting: Oncology

## 2014-08-24 ENCOUNTER — Inpatient Hospital Stay: Payer: BC Managed Care – PPO | Attending: Oncology

## 2014-08-24 VITALS — BP 135/81 | HR 83 | Temp 97.1°F

## 2014-08-24 DIAGNOSIS — M79605 Pain in left leg: Secondary | ICD-10-CM | POA: Diagnosis not present

## 2014-08-24 DIAGNOSIS — Z170 Estrogen receptor positive status [ER+]: Secondary | ICD-10-CM | POA: Insufficient documentation

## 2014-08-24 DIAGNOSIS — I82412 Acute embolism and thrombosis of left femoral vein: Secondary | ICD-10-CM | POA: Diagnosis not present

## 2014-08-24 DIAGNOSIS — G35 Multiple sclerosis: Secondary | ICD-10-CM | POA: Diagnosis not present

## 2014-08-24 DIAGNOSIS — I10 Essential (primary) hypertension: Secondary | ICD-10-CM

## 2014-08-24 DIAGNOSIS — C50912 Malignant neoplasm of unspecified site of left female breast: Secondary | ICD-10-CM | POA: Diagnosis not present

## 2014-08-24 DIAGNOSIS — Z79810 Long term (current) use of selective estrogen receptor modulators (SERMs): Secondary | ICD-10-CM | POA: Diagnosis not present

## 2014-08-24 DIAGNOSIS — R531 Weakness: Secondary | ICD-10-CM | POA: Insufficient documentation

## 2014-08-24 DIAGNOSIS — Z452 Encounter for adjustment and management of vascular access device: Secondary | ICD-10-CM | POA: Diagnosis not present

## 2014-08-24 DIAGNOSIS — C50919 Malignant neoplasm of unspecified site of unspecified female breast: Secondary | ICD-10-CM

## 2014-08-25 LAB — FSH/LH
FSH: 57.3 m[IU]/mL
LH: 38.6 m[IU]/mL

## 2014-09-06 ENCOUNTER — Other Ambulatory Visit: Payer: Self-pay | Admitting: Surgery

## 2014-09-06 DIAGNOSIS — Z853 Personal history of malignant neoplasm of breast: Secondary | ICD-10-CM

## 2014-09-07 ENCOUNTER — Other Ambulatory Visit: Payer: Self-pay | Admitting: Internal Medicine

## 2014-09-07 ENCOUNTER — Inpatient Hospital Stay: Payer: BC Managed Care – PPO

## 2014-09-07 DIAGNOSIS — E7800 Pure hypercholesterolemia, unspecified: Secondary | ICD-10-CM | POA: Insufficient documentation

## 2014-09-07 DIAGNOSIS — C801 Malignant (primary) neoplasm, unspecified: Secondary | ICD-10-CM

## 2014-09-07 DIAGNOSIS — C50912 Malignant neoplasm of unspecified site of left female breast: Secondary | ICD-10-CM | POA: Diagnosis not present

## 2014-09-07 DIAGNOSIS — F325 Major depressive disorder, single episode, in full remission: Secondary | ICD-10-CM | POA: Insufficient documentation

## 2014-09-07 DIAGNOSIS — R609 Edema, unspecified: Secondary | ICD-10-CM

## 2014-09-07 MED ORDER — SODIUM CHLORIDE 0.9 % IJ SOLN
10.0000 mL | INTRAMUSCULAR | Status: DC | PRN
  Administered 2014-09-07: 10 mL via INTRAVENOUS
  Filled 2014-09-07: qty 10

## 2014-09-07 MED ORDER — HEPARIN SOD (PORK) LOCK FLUSH 100 UNIT/ML IV SOLN
500.0000 [IU] | Freq: Once | INTRAVENOUS | Status: AC
  Administered 2014-09-07: 500 [IU] via INTRAVENOUS

## 2014-09-07 MED ORDER — HEPARIN SOD (PORK) LOCK FLUSH 100 UNIT/ML IV SOLN
INTRAVENOUS | Status: AC
  Filled 2014-09-07: qty 5

## 2014-09-08 ENCOUNTER — Encounter: Payer: Self-pay | Admitting: *Deleted

## 2014-09-08 ENCOUNTER — Ambulatory Visit
Admission: RE | Admit: 2014-09-08 | Discharge: 2014-09-08 | Disposition: A | Discharge status: Home / Self Care | Payer: BC Managed Care – PPO | Source: Ambulatory Visit | Attending: Internal Medicine | Admitting: Internal Medicine

## 2014-09-08 ENCOUNTER — Emergency Department
Admission: EM | Admit: 2014-09-08 | Discharge: 2014-09-08 | Disposition: A | Discharge status: Home / Self Care | Payer: BC Managed Care – PPO | Attending: Emergency Medicine | Admitting: Emergency Medicine

## 2014-09-08 DIAGNOSIS — R2242 Localized swelling, mass and lump, left lower limb: Secondary | ICD-10-CM | POA: Diagnosis present

## 2014-09-08 DIAGNOSIS — I82402 Acute embolism and thrombosis of unspecified deep veins of left lower extremity: Secondary | ICD-10-CM

## 2014-09-08 DIAGNOSIS — I82412 Acute embolism and thrombosis of left femoral vein: Secondary | ICD-10-CM

## 2014-09-08 DIAGNOSIS — R609 Edema, unspecified: Secondary | ICD-10-CM

## 2014-09-08 DIAGNOSIS — Z79899 Other long term (current) drug therapy: Secondary | ICD-10-CM | POA: Insufficient documentation

## 2014-09-08 DIAGNOSIS — G35 Multiple sclerosis: Secondary | ICD-10-CM | POA: Insufficient documentation

## 2014-09-08 DIAGNOSIS — I10 Essential (primary) hypertension: Secondary | ICD-10-CM | POA: Diagnosis not present

## 2014-09-08 DIAGNOSIS — Z7901 Long term (current) use of anticoagulants: Secondary | ICD-10-CM | POA: Insufficient documentation

## 2014-09-08 HISTORY — DX: Essential (primary) hypertension: I10

## 2014-09-08 HISTORY — DX: Multiple sclerosis: G35

## 2014-09-08 LAB — COMPREHENSIVE METABOLIC PANEL
ALT: 22 U/L (ref 14–54)
ANION GAP: 10 (ref 5–15)
AST: 29 U/L (ref 15–41)
Albumin: 4 g/dL (ref 3.5–5.0)
Alkaline Phosphatase: 78 U/L (ref 38–126)
BUN: 9 mg/dL (ref 6–20)
CO2: 27 mmol/L (ref 22–32)
CREATININE: 0.71 mg/dL (ref 0.44–1.00)
Calcium: 8.9 mg/dL (ref 8.9–10.3)
Chloride: 99 mmol/L — ABNORMAL LOW (ref 101–111)
Glucose, Bld: 95 mg/dL (ref 65–99)
Potassium: 3.5 mmol/L (ref 3.5–5.1)
SODIUM: 136 mmol/L (ref 135–145)
Total Bilirubin: 0.3 mg/dL (ref 0.3–1.2)
Total Protein: 7.4 g/dL (ref 6.5–8.1)

## 2014-09-08 LAB — CBC
HCT: 37.2 % (ref 35.0–47.0)
Hemoglobin: 12.7 g/dL (ref 12.0–16.0)
MCH: 28.9 pg (ref 26.0–34.0)
MCHC: 34.2 g/dL (ref 32.0–36.0)
MCV: 84.7 fL (ref 80.0–100.0)
PLATELETS: 226 10*3/uL (ref 150–440)
RBC: 4.39 MIL/uL (ref 3.80–5.20)
RDW: 13.6 % (ref 11.5–14.5)
WBC: 5.3 10*3/uL (ref 3.6–11.0)

## 2014-09-08 MED ORDER — RIVAROXABAN 15 MG PO TABS
15.0000 mg | ORAL_TABLET | Freq: Once | ORAL | Status: AC
  Administered 2014-09-08: 15 mg via ORAL
  Filled 2014-09-08: qty 1

## 2014-09-08 MED ORDER — RIVAROXABAN 15 MG PO TABS
15.0000 mg | ORAL_TABLET | Freq: Two times a day (BID) | ORAL | Status: DC
Start: 2014-09-08 — End: 2015-02-26

## 2014-09-08 NOTE — ED Notes (Addendum)
Pt arrives from ultrasound department after going for evaluation of the left leg swelling since the end of May. Pt is +DVT left femoral vein. Sent to the ED for further evaluation

## 2014-09-08 NOTE — Discharge Instructions (Signed)
Deep Vein Thrombosis °A deep vein thrombosis (DVT) is a blood clot that develops in the deep, larger veins of the leg, arm, or pelvis. These are more dangerous than clots that might form in veins near the surface of the body. A DVT can lead to serious and even life-threatening complications if the clot breaks off and travels in the bloodstream to the lungs.  °A DVT can damage the valves in your leg veins so that instead of flowing upward, the blood pools in the lower leg. This is called post-thrombotic syndrome, and it can result in pain, swelling, discoloration, and sores on the leg. °CAUSES °Usually, several things contribute to the formation of blood clots. Contributing factors include: °· The flow of blood slows down. °· The inside of the vein is damaged in some way. °· You have a condition that makes blood clot more easily. °RISK FACTORS °Some people are more likely than others to develop blood clots. Risk factors include:  °· Smoking. °· Being overweight (obese). °· Sitting or lying still for a long time. This includes long-distance travel, paralysis, or recovery from an illness or surgery. °Other factors that increase risk are:  °· Older age, especially over 75 years of age. °· Having a family history of blood clots or if you have already had a blot clot. °· Having major or lengthy surgery. This is especially true for surgery on the hip, knee, or belly (abdomen). Hip surgery is particularly high risk. °· Having a long, thin tube (catheter) placed inside a vein during a medical procedure. °· Breaking a hip or leg. °· Having cancer or cancer treatment. °· Pregnancy and childbirth. °¨ Hormone changes make the blood clot more easily during pregnancy. °¨ The fetus puts pressure on the veins of the pelvis. °¨ There is a risk of injury to veins during delivery or a caesarean delivery. The risk is highest just after childbirth. °· Medicines containing the female hormone estrogen. This includes birth control pills and  hormone replacement therapy. °· Other circulation or heart problems. ° °SIGNS AND SYMPTOMS °When a clot forms, it can either partially or totally block the blood flow in that vein. Symptoms of a DVT can include: °· Swelling of the leg or arm, especially if one side is much worse. °· Warmth and redness of the leg or arm, especially if one side is much worse. °· Pain in an arm or leg. If the clot is in the leg, symptoms may be more noticeable or worse when standing or walking. °The symptoms of a DVT that has traveled to the lungs (pulmonary embolism, PE) usually start suddenly and include: °· Shortness of breath. °· Coughing. °· Coughing up blood or blood-tinged mucus. °· Chest pain. The chest pain is often worse with deep breaths. °· Rapid heartbeat. °Anyone with these symptoms should get emergency medical treatment right away. Do not wait to see if the symptoms will go away. Call your local emergency services (911 in the [place] if you have these symptoms. Do not drive yourself to the hospital. °DIAGNOSIS °If a DVT is suspected, your health care provider will take a full medical history and perform a physical exam. Tests that also may be required include: °· Blood tests, including studies of the clotting properties of the blood. °· Ultrasound to see if you have clots in your legs or lungs. °· X-rays to show the flow of blood when dye is injected into the veins (venogram). °· Studies of your lungs if you have any   chest symptoms. °PREVENTION °· Exercise the legs regularly. Take a brisk 30-minute walk every day. °· Maintain a weight that is appropriate for your height. °· Avoid sitting or lying in bed for long periods of time without moving your legs. °· Women, particularly those over the age of 35 years, should consider the risks and benefits of taking estrogen medicines, including birth control pills. °· Do not smoke, especially if you take estrogen medicines. °· Long-distance travel can increase your risk of DVT. You  should exercise your legs by walking or pumping the muscles every hour. °· Many of the risk factors above relate to situations that exist with hospitalization, either for illness, injury, or elective surgery. Prevention may include medical and nonmedical measures. °¨ Your health care provider will assess you for the need for venous thromboembolism prevention when you are admitted to the hospital. If you are having surgery, your surgeon will assess you the day of or day after surgery. °TREATMENT °Once identified, a DVT can be treated. It can also be prevented in some circumstances. Once you have had a DVT, you may be at increased risk for a DVT in the future. The most common treatment for DVT is blood-thinning (anticoagulant) medicine, which reduces the blood's tendency to clot. Anticoagulants can stop new blood clots from forming and stop old clots from growing. They cannot dissolve existing clots. Your body does this by itself over time. Anticoagulants can be given by mouth, through an IV tube, or by injection. Your health care provider will determine the best program for you. Other medicines or treatments that may be used are: °· Heparin or related medicines (low molecular weight heparin) are often the first treatment for a blood clot. They act quickly. However, they cannot be taken orally and must be given either in shot form or by IV tube. °¨ Heparin can cause a fall in a component of blood that stops bleeding and forms blood clots (platelets). You will be monitored with blood tests to be sure this does not occur. °· Warfarin is an anticoagulant that can be swallowed. It takes a few days to start working, so usually heparin or related medicines are used in combination. Once warfarin is working, heparin is usually stopped. °· Factor Xa inhibitor medicines, such as rivaroxaban and apixaban, also reduce blood clotting. These medicines are taken orally and can often be used without heparin or related  medicines. °· Less commonly, clot dissolving drugs (thrombolytics) are used to dissolve a DVT. They carry a high risk of bleeding, so they are used mainly in severe cases where your life or a part of your body is threatened. °· Very rarely, a blood clot in the leg needs to be removed surgically. °· If you are unable to take anticoagulants, your health care provider may arrange for you to have a filter placed in a main vein in your abdomen. This filter prevents clots from traveling to your lungs. °HOME CARE INSTRUCTIONS °· Take all medicines as directed by your health care provider. °· Learn as much as you can about DVT. °· Wear a medical alert bracelet or carry a medical alert card. °· Ask your health care provider how soon you can go back to normal activities. It is important to stay active to prevent blood clots. If you are on anticoagulant medicine, avoid contact sports. °· It is very important to exercise. This is especially important while traveling, sitting, or standing for long periods of time. Exercise your legs by walking or by   tightening and relaxing your leg muscles regularly. Take frequent walks. °· You may need to wear compression stockings. These are tight elastic stockings that apply pressure to the lower legs. This pressure can help keep the blood in the legs from clotting. °Taking Warfarin °Warfarin is a daily medicine that is taken by mouth. Your health care provider will advise you on the length of treatment (usually 3-6 months, sometimes lifelong). If you take warfarin: °· Understand how to take warfarin and foods that can affect how warfarin works in your body. °· Too much and too little warfarin are both dangerous. Too much warfarin increases the risk of bleeding. Too little warfarin continues to allow the risk for blood clots. °Warfarin and Regular Blood Testing °While taking warfarin, you will need to have regular blood tests to measure your blood clotting time. These blood tests usually  include both the prothrombin time (PT) and international normalized ratio (INR) tests. The PT and INR results allow your health care provider to adjust your dose of warfarin. It is very important that you have your PT and INR tested as often as directed by your health care provider.    °Warfarin and Your Diet °Avoid major changes in your diet, or notify your health care provider before changing your diet. Arrange a visit with a registered dietitian to answer your questions. Many foods, especially foods high in vitamin K, can interfere with warfarin and affect the PT and INR results. You should eat a consistent amount of foods high in vitamin K. Foods high in vitamin K include:  °· Spinach, kale, broccoli, cabbage, collard and turnip greens, Brussels sprouts, peas, cauliflower, seaweed, and parsley. °· Beef and pork liver. °· Green tea. °· Soybean oil. °Warfarin with Other Medicines °Many medicines can interfere with warfarin and affect the PT and INR results. You must: °· Tell your health care provider about any and all medicines, vitamins, and supplements you take, including aspirin and other over-the-counter anti-inflammatory medicines. Be especially cautious with aspirin and anti-inflammatory medicines. Ask your health care provider before taking these. °· Do not take or discontinue any prescribed or over-the-counter medicine except on the advice of your health care provider or pharmacist. °Warfarin Side Effects °Warfarin can have side effects, such as easy bruising and difficulty stopping bleeding. Ask your health care provider or pharmacist about other side effects of warfarin. You will need to: °· Hold pressure over cuts for longer than usual. °· Notify your dentist and other health care providers that you are taking warfarin before you undergo any procedures where bleeding may occur. °Warfarin with Alcohol and Tobacco  °· Drinking alcohol frequently can increase the effect of warfarin, leading to excess  bleeding. It is best to avoid alcoholic drinks or to consume only very small amounts while taking warfarin. Notify your health care provider if you change your alcohol intake.   °· Do not use any tobacco products including cigarettes, chewing tobacco, or electronic cigarettes. If you smoke, quit. Ask your health care provider for help with quitting smoking. °Alternative Medicines to Warfarin: Factor Xa Inhibitor Medicines °· These blood-thinning medicines are taken by mouth, usually for several weeks or longer. It is important to take the medicine every single day at the same time each day. °· There are no regular blood tests required when using these medicines. °· There are fewer food and drug interactions than with warfarin. °· The side effects of this class of medicine are similar to those of warfarin, including excessive bruising or bleeding. Ask your   health care provider or pharmacist about other potential side effects. SEEK MEDICAL CARE IF:  You notice a rapid heartbeat.  You feel weaker or more tired than usual.  You feel faint.  You notice increased bruising.  You feel your symptoms are not getting better in the time expected.  You believe you are having side effects of medicine. SEEK IMMEDIATE MEDICAL CARE IF:  You have chest pain.  You have trouble breathing.  You have new or increased swelling or pain in one leg.  You cough up blood.  You notice blood in vomit, in a bowel movement, or in urine. MAKE SURE YOU:  Understand these instructions.  Will watch your condition.  Will get help right away if you are not doing well or get worse. Document Released: 01/27/2005 Document Revised: 06/13/2013 Document Reviewed: 10/04/2012 [facility] Patient Information 2015 [place], [place]. This information is not intended to replace advice given to you by your health care provider. Make sure you discuss any questions you have with your health care provider.   Take the eliquis as directed.  DO NOT FALL!! If you fall and hit your head or have pain after falling come to the ER at once!

## 2014-09-08 NOTE — ED Provider Notes (Signed)
[facility] Emergency Department Provider Note  ____________________________________________  Time seen: Approximately 9:09 PM  I have reviewed the triage vital signs and the nursing notes.   HISTORY  Chief Complaint Leg Swelling    HPI Rachel Hall is a 49 y.o. female patient reports she fell and twisted her foot backwards couple weeks ago. Went to the office today for checkup for other things and was noted by Dr. [name] to have swelling in the foot and the leg. Was sent here for ultrasound. Ultrasound showed a left femoral vein DVT. Patient has multiple sclerosis and was having pretty frequent falls. However after being treated for breast cancer with chemotherapy she is now wheelchair bound and is not walking anymore. She is not falling anymore either. I will therefore treat her with by mouth Eliquis for her DVT. I discussed this in detail with her including the difficulty with any attempt to reverse Eliquis and the risk of bleeding if she falls. Also told her about Coumadin and the need for multiple sticks to regulate the Coumadin level and the difficulty regulating Coumadin level the fact that she can bleed on that as well. Patient will follow with Dr. Ouida Sills this coming week.   Past Medical History  Diagnosis Date  . MS (multiple sclerosis)   . Hypertension     Patient Active Problem List   Diagnosis Date Noted  . Breast CA 02/16/2014  . Abnormal gait 02/17/2013  . Urgency of micturation 09/11/2011  . Muscle spasticity 06/13/2011  . BP (high blood pressure) 06/12/2011  . DS (disseminated sclerosis) 06/12/2011    History reviewed. No pertinent past surgical history.  Current Outpatient Rx  Name  Route  Sig  Dispense  Refill  . acetaminophen (TYLENOL) 325 MG tablet   Oral   Take by mouth.         Marland Kitchen amantadine (SYMMETREL) 100 MG capsule               . amLODipine (NORVASC) 5 MG tablet               . baclofen  (LIORESAL) 10 MG tablet               . Calcium Carbonate-Vitamin D 600-400 MG-UNIT per tablet   Oral   Take by mouth.         Marland Kitchen FLUoxetine (PROZAC) 20 MG capsule               . omeprazole (PRILOSEC) 20 MG capsule   Oral   Take by mouth.         . oxybutynin (DITROPAN) 5 MG tablet               . Rivaroxaban (XARELTO) 15 MG TABS tablet   Oral   Take 1 tablet (15 mg total) by mouth 2 (two) times daily with a meal.   30 tablet   0   . tamoxifen (NOLVADEX) 20 MG tablet               . tiZANidine (ZANAFLEX) 4 MG tablet   Oral   Take by mouth.           Allergies Review of patient's allergies indicates no known allergies.  No family history on file.  Social History History  Substance Use Topics  . Smoking status: Never Smoker   . Smokeless tobacco: Not on file  . Alcohol Use: No    Review of Systems Constitutional: No fever/chills Eyes:  No visual changes. ENT: No sore throat. Cardiovascular: Denies chest pain. Respiratory: Denies shortness of breath. Gastrointestinal: No abdominal pain.  No nausea, no vomiting.  No diarrhea.  No constipation. Genitourinary: Negative for dysuria. Musculoskeletal: Negative for back pain. Skin: Negative for rash. Neurological: Negative for headaches, focal weakness or numbness.  10-point ROS otherwise negative.  ____________________________________________   PHYSICAL EXAM:  VITAL SIGNS: ED Triage Vitals  Enc Vitals Group     BP 09/08/14 1837 125/76 mmHg     Pulse Rate 09/08/14 1837 79     Resp 09/08/14 1837 18     Temp 09/08/14 1837 98.2 F (36.8 C)     Temp Source 09/08/14 1837 Oral     SpO2 09/08/14 1837 98 %     Weight 09/08/14 1837 120 lb (54.432 kg)     Height 09/08/14 1837 5\' 2"  (1.575 m)     Head Cir --      Peak Flow --      Pain Score --      Pain Loc --      Pain Edu? --      Excl. in [place]? --     Constitutional: Alert and oriented. Well appearing and in no acute distress. Eyes:  Conjunctivae are normal. PERRL. EOMI. Head: Atraumatic. Nose: No congestion/rhinnorhea. Mouth/Throat: Mucous membranes are moist.  Oropharynx non-erythematous. Neck: No stridor. Cardiovascular: Normal rate, regular rhythm. Grossly normal heart sounds.  Good peripheral circulation. Respiratory: Normal respiratory effort.  No retractions. Lungs CTAB. Gastrointestinal: Soft and nontender. No distention. No abdominal bruits. No CVA tenderness. Patient has a baclofen pump on her abdomen Musculoskeletal: Left leg is swollen and firm with edema. There is no tenderness or redness.  No joint effusions. Skin:  Skin is warm, dry and intact. No rash noted. Psychiatric: Mood and affect are normal. Speech and behavior are normal.  ____________________________________________   LABS (all labs ordered are listed, but only abnormal results are displayed)  Labs Reviewed  COMPREHENSIVE METABOLIC PANEL - Abnormal; Notable for the following:    Chloride 99 (*)    All other components within normal limits  CBC   ____________________________________________  EKG   ____________________________________________  RADIOLOGY  Ultrasound shows a left femoral vein DVT ____________________________________________   PROCEDURES   ____________________________________________   INITIAL IMPRESSION / ASSESSMENT AND PLAN / ED COURSE  Pertinent labs & imaging results that were available during my care of the patient were reviewed by me and considered in my medical decision making (see chart for details).   ____________________________________________   FINAL CLINICAL IMPRESSION(S) / ED DIAGNOSES  Final diagnoses:  DVT (deep venous thrombosis), left      Nena Polio, MD 09/12/14 [phone]

## 2014-09-09 NOTE — Progress Notes (Signed)
[facility]  Telephone:([phone] Fax:([phone]) [phone]  ID: Rachel Hall OB: 31-Mar-1965  MR#: [id number]  RVI#:[id number]  Patient Care Team: Lauro Regulus, MD as PCP - General (Internal Medicine)  CHIEF COMPLAINT:  Chief Complaint  Patient presents with  . Follow-up  . Breast Cancer    INTERVAL HISTORY: Patient returns to clinic today for routine 3 month follow-up. She is tolerating tamoxifen well without significant side effects. Her symptoms from multiple sclerosis are unchanged. She reports she recently received an infusion of Rituxan. She has no new neurologic complaints. She denies any recent fevers. She has no chest pain or shortness of breath. She denies any nausea, vomiting, constipation, or diarrhea. She has no urinary complaints. Patient offers no further specific complaints today.   REVIEW OF SYSTEMS:   Review of Systems  Constitutional: Negative.   Respiratory: Negative.   Cardiovascular: Negative.   Musculoskeletal: Negative.   Neurological: Positive for focal weakness.    As per HPI. Otherwise, a complete review of systems is negatve.  PAST MEDICAL HISTORY: Past Medical History  Diagnosis Date  . MS (multiple sclerosis)   . Hypertension     PAST SURGICAL HISTORY: No past surgical history on file.  FAMILY HISTORY: Reviewed and unchanged. No reported history of malignancy or chronic disease.     ADVANCED DIRECTIVES:    HEALTH MAINTENANCE: History  Substance Use Topics  . Smoking status: Never Smoker   . Smokeless tobacco: Not on file  . Alcohol Use: No     Colonoscopy:  PAP:  Bone density:  Lipid panel:  No Known Allergies  Current Outpatient Prescriptions  Medication Sig Dispense Refill  . acetaminophen (TYLENOL) 325 MG tablet Take by mouth.    Marland Kitchen amantadine (SYMMETREL) 100 MG capsule     . amLODipine (NORVASC) 5 MG tablet     . baclofen (LIORESAL) 10 MG tablet     . Calcium Carbonate-Vitamin D 600-400  MG-UNIT per tablet Take by mouth.    Marland Kitchen FLUoxetine (PROZAC) 20 MG capsule     . omeprazole (PRILOSEC) 20 MG capsule Take by mouth.    . oxybutynin (DITROPAN) 5 MG tablet     . tamoxifen (NOLVADEX) 20 MG tablet     . tiZANidine (ZANAFLEX) 4 MG tablet Take by mouth.    . Rivaroxaban (XARELTO) 15 MG TABS tablet Take 1 tablet (15 mg total) by mouth 2 (two) times daily with a meal. 30 tablet 0   No current facility-administered medications for this visit.   Facility-Administered Medications Ordered in Other Visits  Medication Dose Route Frequency Provider Last Rate Last Dose  . sodium chloride 0.9 % injection 10 mL  10 mL Intravenous PRN Johney [place], MD   10 mL at 06/23/14 1106    OBJECTIVE: Filed Vitals:   08/24/14 1109  BP: 135/81  Pulse: 83  Temp: 97.1 F (36.2 C)     There is no height or weight on file to calculate BMI.    ECOG FS:0 - Asymptomatic  General: Well-developed, well-nourished, no acute distress. Eyes: Pink conjunctiva, anicteric sclera. Breasts: Patient requested exam be deferred today.  Lungs: Clear to auscultation bilaterally. Heart: Regular rate and rhythm. No rubs, murmurs, or gallops. Abdomen: Soft, nontender, nondistended. No organomegaly noted, normoactive bowel sounds. Musculoskeletal: No edema, cyanosis, or clubbing. Neuro: Alert, answering all questions appropriately. Cranial nerves grossly intact. Skin: No rashes or petechiae noted. Psych: Normal affect.   LAB RESULTS:  Lab Results  Component Value Date  NA 136 09/08/2014   K 3.5 09/08/2014   CL 99* 09/08/2014   CO2 27 09/08/2014   GLUCOSE 95 09/08/2014   BUN 9 09/08/2014   CREATININE 0.71 09/08/2014   CALCIUM 8.9 09/08/2014   PROT 7.4 09/08/2014   ALBUMIN 4.0 09/08/2014   AST 29 09/08/2014   ALT 22 09/08/2014   ALKPHOS 78 09/08/2014   BILITOT 0.3 09/08/2014   GFRNONAA >60 09/08/2014   GFRAA >60 09/08/2014    Lab Results  Component Value Date   WBC 5.3 09/08/2014   NEUTROABS 6.0  02/13/2014   HGB 12.7 09/08/2014   HCT 37.2 09/08/2014   MCV 84.7 09/08/2014   PLT 226 09/08/2014     STUDIES: US Venous Img Lower Unilateral Left  09/08/2014   CLINICAL DATA:  49 year old female with left lower extremity pain and swelling for 2 months.  EXAM: LEFT LOWER EXTREMITY VENOUS DOPPLER ULTRASOUND  TECHNIQUE: Gray-scale sonography with graded compression, as well as color Doppler and duplex ultrasound were performed to evaluate the lower extremity deep venous systems from the level of the common femoral vein and including the common femoral, femoral, profunda femoral, popliteal and calf veins including the posterior tibial, peroneal and gastrocnemius veins when visible. The superficial great saphenous vein was also interrogated. Spectral Doppler was utilized to evaluate flow at rest and with distal augmentation maneuvers in the common femoral, femoral and popliteal veins.  COMPARISON:  None.  FINDINGS: There is DVT within the left femoral vein.  There is no evidence of DVT within the left common femoral, saphenous, profunda femoral, popliteal or visualized calf veins.  There is no evidence of DVT within the right common femoral vein.  No other abnormalities are identified.  IMPRESSION: Left femoral vein DVT.  These results were called by the technologist to the referring physician.   Electronically Signed   By: Margarette Canada M.D.   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  PLAN:    1. Breast cancer:  Chemotherapy was discontinued early secondary to worsening multiple sclerosis. Case was discussed at length with her neurologist. Continue tamoxifen for total 5 years completing in April 2021. Based on patient's LH and [place] she has progressed into menopause, therefore will consider switching tamoxifen to an aromatase inhibitor. Return to clinic in 3 months for routine evaluation.  Given that the patient is less than  50 years old, will attempt BCRA testing, but it is unclear at this time if insurance will cover this procedure.  2. Multiple sclerosis: Patient previously was receiving monthly to Tysabri. This has been discontinued and now patient will receive 500 mg Rituxan every 4 months. She reports she received an infusion recently at her neurologist office. Case has been discussed at length with her primary neurologist.  Patient expressed understanding and was in agreement with this plan. She also understands that She can call clinic at any time with any questions, concerns, or complaints.   No matching staging information was found for the patient.  [name], MD   09/09/2014 2:20 PM

## 2014-09-11 DIAGNOSIS — I824Y2 Acute embolism and thrombosis of unspecified deep veins of left proximal lower extremity: Secondary | ICD-10-CM | POA: Insufficient documentation

## 2014-09-11 DIAGNOSIS — Z86718 Personal history of other venous thrombosis and embolism: Secondary | ICD-10-CM | POA: Insufficient documentation

## 2014-09-19 ENCOUNTER — Ambulatory Visit
Admission: RE | Admit: 2014-09-19 | Discharge: 2014-09-19 | Disposition: A | Discharge status: Home / Self Care | Payer: BC Managed Care – PPO | Source: Ambulatory Visit | Attending: Surgery | Admitting: Surgery

## 2014-09-19 ENCOUNTER — Other Ambulatory Visit: Payer: Self-pay | Admitting: Surgery

## 2014-09-19 ENCOUNTER — Ambulatory Visit: Payer: BC Managed Care – PPO

## 2014-09-19 ENCOUNTER — Encounter (INDEPENDENT_AMBULATORY_CARE_PROVIDER_SITE_OTHER): Payer: Self-pay

## 2014-09-19 DIAGNOSIS — Z853 Personal history of malignant neoplasm of breast: Secondary | ICD-10-CM | POA: Insufficient documentation

## 2014-09-19 HISTORY — DX: Personal history of irradiation: Z92.3

## 2014-09-19 HISTORY — DX: Personal history of antineoplastic chemotherapy: Z92.21

## 2014-11-27 ENCOUNTER — Inpatient Hospital Stay: Payer: BC Managed Care – PPO | Attending: Oncology | Admitting: Oncology

## 2014-11-27 ENCOUNTER — Inpatient Hospital Stay: Payer: BC Managed Care – PPO

## 2014-11-27 VITALS — BP 109/72 | HR 78 | Temp 97.1°F | Resp 16

## 2014-11-27 DIAGNOSIS — R531 Weakness: Secondary | ICD-10-CM

## 2014-11-27 DIAGNOSIS — Z170 Estrogen receptor positive status [ER+]: Secondary | ICD-10-CM | POA: Insufficient documentation

## 2014-11-27 DIAGNOSIS — Z7901 Long term (current) use of anticoagulants: Secondary | ICD-10-CM | POA: Insufficient documentation

## 2014-11-27 DIAGNOSIS — G35 Multiple sclerosis: Secondary | ICD-10-CM | POA: Diagnosis not present

## 2014-11-27 DIAGNOSIS — Z923 Personal history of irradiation: Secondary | ICD-10-CM | POA: Insufficient documentation

## 2014-11-27 DIAGNOSIS — C50912 Malignant neoplasm of unspecified site of left female breast: Secondary | ICD-10-CM | POA: Diagnosis not present

## 2014-11-27 DIAGNOSIS — Z86718 Personal history of other venous thrombosis and embolism: Secondary | ICD-10-CM | POA: Diagnosis not present

## 2014-11-27 DIAGNOSIS — C50919 Malignant neoplasm of unspecified site of unspecified female breast: Secondary | ICD-10-CM

## 2014-11-27 DIAGNOSIS — Z452 Encounter for adjustment and management of vascular access device: Secondary | ICD-10-CM | POA: Diagnosis not present

## 2014-11-27 DIAGNOSIS — Z79810 Long term (current) use of selective estrogen receptor modulators (SERMs): Secondary | ICD-10-CM | POA: Insufficient documentation

## 2014-11-27 DIAGNOSIS — I10 Essential (primary) hypertension: Secondary | ICD-10-CM | POA: Insufficient documentation

## 2014-11-27 DIAGNOSIS — Z79899 Other long term (current) drug therapy: Secondary | ICD-10-CM | POA: Insufficient documentation

## 2014-11-27 DIAGNOSIS — Z9221 Personal history of antineoplastic chemotherapy: Secondary | ICD-10-CM | POA: Diagnosis not present

## 2014-11-27 DIAGNOSIS — C801 Malignant (primary) neoplasm, unspecified: Secondary | ICD-10-CM

## 2014-11-27 MED ORDER — HEPARIN SOD (PORK) LOCK FLUSH 100 UNIT/ML IV SOLN
500.0000 [IU] | Freq: Once | INTRAVENOUS | Status: AC
  Administered 2014-11-27: 500 [IU] via INTRAVENOUS
  Filled 2014-11-27: qty 5

## 2014-11-27 MED ORDER — SODIUM CHLORIDE 0.9 % IJ SOLN
10.0000 mL | INTRAMUSCULAR | Status: DC | PRN
  Administered 2014-11-27: 10 mL via INTRAVENOUS
  Filled 2014-11-27: qty 10

## 2014-11-27 NOTE — Progress Notes (Unsigned)
PSN met with patient and her mother today.  Assisted patient in obtaining a co-pay card for the drug, Eliquis.  Advised patient and mother about financial assistance through the cancer center and the [place].

## 2014-11-27 NOTE — Progress Notes (Signed)
Patient had a fall in July where she injured her ankle and was diagnosed with DVT later that month.

## 2014-12-01 NOTE — Progress Notes (Unsigned)
PSN met with patient and mother today to give them information about medical bills and to refer them to outside resources that may benefit patient and her daughter. 

## 2014-12-09 NOTE — Progress Notes (Signed)
[place]  Telephone:(336) [phone] Fax:(336) [phone]  ID: Rachel Hall OB: 1965-06-20  MR#: [id number]  PPG#:[id number]  Patient Care Team: Kirk Ruths, MD as PCP - General (Internal Medicine)  CHIEF COMPLAINT:  Chief Complaint  Patient presents with  . Breast Cancer    INTERVAL HISTORY: Patient returns to clinic today for routine 3 month follow-up. She had a fall in July and during her left leg and subsequently developed a blood clot in the same leg. She continues to take tamoxifen and is tolerating it well without significant side effects. Her symptoms from multiple sclerosis are unchanged. She has no new neurologic complaints. She denies any recent fevers. She has no chest pain or shortness of breath. She denies any nausea, vomiting, constipation, or diarrhea. She has no urinary complaints. Patient offers no further specific complaints today.   REVIEW OF SYSTEMS:   Review of Systems  Constitutional: Negative.   Respiratory: Negative.   Cardiovascular: Negative.   Musculoskeletal: Negative.   Neurological: Positive for focal weakness.    As per HPI. Otherwise, a complete review of systems is negatve.  PAST MEDICAL HISTORY: Past Medical History  Diagnosis Date  . MS (multiple sclerosis)   . Hypertension   . Cancer left     breast cancer  . Status post radiation therapy     breast cancer  . Status post chemotherapy     left breast cancer    PAST SURGICAL HISTORY: Past Surgical History  Procedure Laterality Date  . Breast biopsy Left     positive 2015    FAMILY HISTORY: Reviewed and unchanged. No reported history of malignancy or chronic disease.     ADVANCED DIRECTIVES:    HEALTH MAINTENANCE: Social History  Substance Use Topics  . Smoking status: Never Smoker   . Smokeless tobacco: Not on file  . Alcohol Use: No     Colonoscopy:  PAP:  Bone density:  Lipid panel:  No Known Allergies  Current Outpatient  Prescriptions  Medication Sig Dispense Refill  . acetaminophen (TYLENOL) 325 MG tablet Take by mouth.    Marland Kitchen amantadine (SYMMETREL) 100 MG capsule     . amLODipine (NORVASC) 5 MG tablet     . apixaban (ELIQUIS) 5 MG TABS tablet Take by mouth.    . baclofen (LIORESAL) 10 MG tablet     . Calcium Carbonate-Vitamin D 600-400 MG-UNIT per tablet Take by mouth.    Marland Kitchen FLUoxetine (PROZAC) 20 MG capsule     . omeprazole (PRILOSEC) 20 MG capsule Take by mouth.    . oxybutynin (DITROPAN) 5 MG tablet     . tamoxifen (NOLVADEX) 20 MG tablet     . tiZANidine (ZANAFLEX) 4 MG tablet Take by mouth.    . Rivaroxaban (XARELTO) 15 MG TABS tablet Take 1 tablet (15 mg total) by mouth 2 (two) times daily with a meal. 30 tablet 0   No current facility-administered medications for this visit.   Facility-Administered Medications Ordered in Other Visits  Medication Dose Route Frequency Provider Last Rate Last Dose  . sodium chloride 0.9 % injection 10 mL  10 mL Intravenous PRN Forest Gleason, MD   10 mL at 06/23/14 1106    OBJECTIVE: Filed Vitals:   11/27/14 1241  BP: 109/72  Pulse: 78  Temp: 97.1 F (36.2 C)  Resp: 16     There is no weight on file to calculate BMI.    ECOG FS:1 - Symptomatic but completely ambulatory  General: Well-developed,  well-nourished, no acute distress. Eyes: Pink conjunctiva, anicteric sclera. Breasts: Patient requested exam be deferred today.  Lungs: Clear to auscultation bilaterally. Heart: Regular rate and rhythm. No rubs, murmurs, or gallops. Abdomen: Soft, nontender, nondistended. No organomegaly noted, normoactive bowel sounds. Musculoskeletal: No edema, cyanosis, or clubbing. Neuro: Alert, answering all questions appropriately. Cranial nerves grossly intact. Skin: No rashes or petechiae noted. Psych: Normal affect.   LAB RESULTS:  Lab Results  Component Value Date   NA 136 09/08/2014   K 3.5 09/08/2014   CL 99* 09/08/2014   CO2 27 09/08/2014   GLUCOSE 95  09/08/2014   BUN 9 09/08/2014   CREATININE 0.71 09/08/2014   CALCIUM 8.9 09/08/2014   PROT 7.4 09/08/2014   ALBUMIN 4.0 09/08/2014   AST 29 09/08/2014   ALT 22 09/08/2014   ALKPHOS 78 09/08/2014   BILITOT 0.3 09/08/2014   GFRNONAA >60 09/08/2014   GFRAA >60 09/08/2014    Lab Results  Component Value Date   WBC 5.3 09/08/2014   NEUTROABS 6.0 02/13/2014   HGB 12.7 09/08/2014   HCT 37.2 09/08/2014   MCV 84.7 09/08/2014   PLT 226 09/08/2014     STUDIES: No results found.  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  PLAN:    1. Breast cancer:  Chemotherapy was discontinued early secondary to worsening multiple sclerosis. Case was discussed at length with her neurologist. Continue tamoxifen for total 5 years completing in April 2021. Based on patient's LH and [place] she has progressed into menopause, therefore will consider switching tamoxifen to an aromatase inhibitor. Return to clinic in 3 months for routine evaluation.  Given that the patient is less than 17 years old, will attempt BCRA testing, but it is unclear at this time if insurance will cover this procedure.  2. Multiple sclerosis: Patient previously was receiving monthly to Tysabri. This has been discontinued and now patient will receive 500 mg Rituxan every 4 months. Her next infusion is scheduled for the end of November. Case has been discussed at length with her primary neurologist.  3. DVT: Secondary to injury. Continue Eliquis as prescribed.  Patient expressed understanding and was in agreement with this plan. She also understands that She can call clinic at any time with any questions, concerns, or complaints.    [name], MD   12/09/2014 8:35 PM

## 2014-12-15 ENCOUNTER — Ambulatory Visit
Admission: RE | Admit: 2014-12-15 | Discharge: 2014-12-15 | Disposition: A | Discharge status: Home / Self Care | Payer: BC Managed Care – PPO | Source: Ambulatory Visit | Attending: Radiation Oncology | Admitting: Radiation Oncology

## 2014-12-15 ENCOUNTER — Encounter: Payer: Self-pay | Admitting: Radiation Oncology

## 2014-12-15 VITALS — BP 142/84 | HR 92 | Temp 96.7°F | Resp 18

## 2014-12-15 DIAGNOSIS — C50412 Malignant neoplasm of upper-outer quadrant of left female breast: Secondary | ICD-10-CM

## 2014-12-15 NOTE — Progress Notes (Signed)
Radiation Oncology Follow up Note  Name: Rachel Hall   Date:   12/15/2014 MRN:  [id number] DOB: 1966-01-30    This 49 y.o. female presents to the clinic today for routine followup of her T1cN1M0, Stage IIA left breast cancer, ER/PR positive, HER2neu negative for over-expression.   REFERRING PROVIDER: Dr. Grayland Ormond  HPI: Patient was diagnosed with left breast cancer in September 2015 and underwent local excision plus axillary surgery finding a 1.2cm primary ivasive ductal carcinoma with 1+/4 axillary nodes. She then received 4 cycles of Adriamycin/cytoxan followed by only a couple of doses of weekly taxol which was discontinued due to worsening problems from her multiple sclerosis. She then was treated with adjuvant radiation to the left breast plus regional nodes and completed this in March 2016. At that point she was placed on Tamoxifen in April 2016. This is followed through Dr. Grayland Ormond. She is not ambulatory due to the MS and fell last summer causing injury to her leg and development of a blood clot so that now she is on Eliquis. She denies any current problems of leg swelling, pain, or SOB. She denies bone pain. She has had no problems related to her radiation treatment. She has mild decrease range of motion left upper extremity since her surgery and radiation. No arm swelling. She has noted no lumps or new lesions in her breast.   COMPLICATIONS OF TREATMENT: None  FOLLOW UP COMPLIANCE: keeps appointments.  PHYSICAL EXAM:  BP 142/84 mmHg  Pulse 92  Temp(Src) 96.7 F (35.9 C)  Resp 18  Wt   This is a well-developed, well nourished woman in no acute ditress. She is in a wheelchair. Alert and oriented x 3. Face symmetric, voice normal. Neck supple, no adenopathy. Port in place right clavicular region. No supraclavicular or axillary adenopathy bilaterally. Breast exam: right breast soft, glandular, no dominant mass or suspicious change. Left breast slight firmness and skin tanning as  expected post treatment. Lumpectomy scar well healed, no induration or mass. Good breast symmetry. Lungs: clear. Heart: regular.  No swelling left upper extremity. Mild decrease ROM on superior extension of arm. Strength and sensation intact LUE. Skin: Expected tanning within prior radiation treatment area, general skin dryness. No rashes or suspicious change, No telangiectasia.    RADIOLOGY RESULTS: Bilateral mammogram was obtained 09/19/2014 through Dr. Tamala Julian and showed dense breast tissue but no suspicious changes or lesions by report.  PLAN: She is doing well with no evidence of disease. She has followup with her surgeon in December and in medical oncology in early January. She is to restart her ROM exercises for the left arm and use cream for skin dryness, e.g. Aveeno. Provided she continues to do well she will be seen back in this office in 6 months, sooner should any need arise.

## 2015-01-08 ENCOUNTER — Inpatient Hospital Stay: Payer: BC Managed Care – PPO | Attending: Oncology

## 2015-01-08 DIAGNOSIS — Z452 Encounter for adjustment and management of vascular access device: Secondary | ICD-10-CM | POA: Diagnosis not present

## 2015-01-08 DIAGNOSIS — C50412 Malignant neoplasm of upper-outer quadrant of left female breast: Secondary | ICD-10-CM | POA: Insufficient documentation

## 2015-01-08 DIAGNOSIS — C801 Malignant (primary) neoplasm, unspecified: Secondary | ICD-10-CM

## 2015-01-08 MED ORDER — SODIUM CHLORIDE 0.9 % IJ SOLN
10.0000 mL | INTRAMUSCULAR | Status: DC | PRN
  Administered 2015-01-08: 10 mL via INTRAVENOUS
  Filled 2015-01-08: qty 10

## 2015-01-08 MED ORDER — HEPARIN SOD (PORK) LOCK FLUSH 100 UNIT/ML IV SOLN
500.0000 [IU] | Freq: Once | INTRAVENOUS | Status: AC
  Administered 2015-01-08: 500 [IU] via INTRAVENOUS
  Filled 2015-01-08: qty 5

## 2015-01-11 DIAGNOSIS — Z978 Presence of other specified devices: Secondary | ICD-10-CM | POA: Insufficient documentation

## 2015-02-26 ENCOUNTER — Inpatient Hospital Stay: Payer: BC Managed Care – PPO

## 2015-02-26 ENCOUNTER — Inpatient Hospital Stay: Payer: BC Managed Care – PPO | Attending: Oncology | Admitting: Oncology

## 2015-02-26 VITALS — BP 151/73 | HR 78 | Temp 97.6°F

## 2015-02-26 DIAGNOSIS — Z9221 Personal history of antineoplastic chemotherapy: Secondary | ICD-10-CM | POA: Insufficient documentation

## 2015-02-26 DIAGNOSIS — Z780 Asymptomatic menopausal state: Secondary | ICD-10-CM

## 2015-02-26 DIAGNOSIS — Z7901 Long term (current) use of anticoagulants: Secondary | ICD-10-CM | POA: Insufficient documentation

## 2015-02-26 DIAGNOSIS — C50912 Malignant neoplasm of unspecified site of left female breast: Secondary | ICD-10-CM | POA: Insufficient documentation

## 2015-02-26 DIAGNOSIS — C801 Malignant (primary) neoplasm, unspecified: Secondary | ICD-10-CM

## 2015-02-26 DIAGNOSIS — G35 Multiple sclerosis: Secondary | ICD-10-CM | POA: Diagnosis not present

## 2015-02-26 DIAGNOSIS — Z923 Personal history of irradiation: Secondary | ICD-10-CM | POA: Diagnosis not present

## 2015-02-26 DIAGNOSIS — Z79811 Long term (current) use of aromatase inhibitors: Secondary | ICD-10-CM | POA: Diagnosis not present

## 2015-02-26 DIAGNOSIS — Z452 Encounter for adjustment and management of vascular access device: Secondary | ICD-10-CM | POA: Insufficient documentation

## 2015-02-26 DIAGNOSIS — Z79899 Other long term (current) drug therapy: Secondary | ICD-10-CM | POA: Insufficient documentation

## 2015-02-26 DIAGNOSIS — Z86718 Personal history of other venous thrombosis and embolism: Secondary | ICD-10-CM

## 2015-02-26 DIAGNOSIS — R5383 Other fatigue: Secondary | ICD-10-CM | POA: Insufficient documentation

## 2015-02-26 DIAGNOSIS — R531 Weakness: Secondary | ICD-10-CM | POA: Insufficient documentation

## 2015-02-26 DIAGNOSIS — I10 Essential (primary) hypertension: Secondary | ICD-10-CM | POA: Insufficient documentation

## 2015-02-26 DIAGNOSIS — Z170 Estrogen receptor positive status [ER+]: Secondary | ICD-10-CM | POA: Insufficient documentation

## 2015-02-26 MED ORDER — SODIUM CHLORIDE 0.9 % IJ SOLN
10.0000 mL | INTRAMUSCULAR | Status: DC | PRN
  Administered 2015-02-26: 10 mL via INTRAVENOUS
  Filled 2015-02-26: qty 10

## 2015-02-26 MED ORDER — HEPARIN SOD (PORK) LOCK FLUSH 100 UNIT/ML IV SOLN
INTRAVENOUS | Status: AC
  Filled 2015-02-26: qty 5

## 2015-02-26 MED ORDER — LETROZOLE 2.5 MG PO TABS: 2.5000 mg | ORAL_TABLET | Freq: Every day | ORAL | Status: DC

## 2015-02-26 MED ORDER — HEPARIN SOD (PORK) LOCK FLUSH 100 UNIT/ML IV SOLN
500.0000 [IU] | Freq: Once | INTRAVENOUS | Status: AC
  Administered 2015-02-26: 500 [IU] via INTRAVENOUS

## 2015-02-26 NOTE — Progress Notes (Signed)
[place]  Telephone:(336) [phone]  Fax:(336) [phone]     Rachel Hall DOB: January 03, 1966  MR#: [id number]  FHQ#:[id number]  Patient Care Team: Kirk Ruths, MD as PCP - General (Internal Medicine)  CHIEF COMPLAINT:  Chief Complaint  Patient presents with  . Breast Cancer    INTERVAL HISTORY:  Patient is here for further follow-up and treatment consideration regarding carcinoma of breast. Patient also has history of multiple sclerosis, with chronic weakness. She is currently on tamoxifen once daily. She did develop a DVT in July 2016, she is currently on eliquis. She has no new neurologic complaints, follows with neurologist at [facility] on a regular basis. Denies any fevers, chills, chest pain, shortness of breath, nausea, vomiting, constipation, or diarrhea. Patient overall reports being in stable health denies any new falls or complaints.  REVIEW OF SYSTEMS:   Review of Systems  Constitutional: Positive for malaise/fatigue. Negative for fever, chills, weight loss and diaphoresis.  HENT: Negative.   Eyes: Negative.   Respiratory: Negative for cough, hemoptysis, sputum production, shortness of breath and wheezing.   Cardiovascular: Negative for chest pain, palpitations, orthopnea, claudication, leg swelling and PND.  Gastrointestinal: Negative for heartburn, nausea, vomiting, abdominal pain, diarrhea, constipation, blood in stool and melena.  Genitourinary: Negative.   Musculoskeletal: Negative.   Skin: Negative.   Neurological: Positive for focal weakness and weakness. Negative for dizziness, tingling and seizures.  Endo/Heme/Allergies: Does not bruise/bleed easily.  Psychiatric/Behavioral: Negative for depression. The patient is not nervous/anxious and does not have insomnia.     As per HPI. Otherwise, a complete review of systems is negatve.  PAST MEDICAL HISTORY: Past Medical History  Diagnosis Date  . MS (multiple sclerosis) ([place])   . Hypertension    . Cancer [facility]) left     breast cancer  . Status post radiation therapy     breast cancer  . Status post chemotherapy     left breast cancer    PAST SURGICAL HISTORY: Past Surgical History  Procedure Laterality Date  . Breast biopsy Left     positive 2015    FAMILY HISTORY Family History  Problem Relation Age of Onset  . Breast cancer Neg Hx     GYNECOLOGIC HISTORY:  No LMP recorded. Patient is postmenopausal.     ADVANCED DIRECTIVES:    HEALTH MAINTENANCE: Social History  Substance Use Topics  . Smoking status: Never Smoker   . Smokeless tobacco: Not on file  . Alcohol Use: No     Colonoscopy:  PAP:  Bone density:  Lipid panel:  No Known Allergies  Current Outpatient Prescriptions  Medication Sig Dispense Refill  . acetaminophen (TYLENOL) 325 MG tablet Take by mouth.    Marland Kitchen amantadine (SYMMETREL) 100 MG capsule     . amLODipine (NORVASC) 5 MG tablet     . apixaban (ELIQUIS) 5 MG TABS tablet Take by mouth.    . baclofen (LIORESAL) 10 MG tablet     . Calcium Carbonate-Vitamin D 600-400 MG-UNIT per tablet Take by mouth.    Marland Kitchen FLUoxetine (PROZAC) 20 MG capsule     . omeprazole (PRILOSEC) 20 MG capsule Take by mouth.    . oxybutynin (DITROPAN) 5 MG tablet     . tiZANidine (ZANAFLEX) 4 MG tablet Take by mouth.    . letrozole (FEMARA) 2.5 MG tablet Take 1 tablet (2.5 mg total) by mouth daily. 30 tablet 3  . oxyCODONE (OXY IR/ROXICODONE) 5 MG immediate release tablet  No current facility-administered medications for this visit.   Facility-Administered Medications Ordered in Other Visits  Medication Dose Route Frequency Provider Last Rate Last Dose  . sodium chloride 0.9 % injection 10 mL  10 mL Intravenous PRN Johney Maine, MD   10 mL at 06/23/14 1106    OBJECTIVE: BP 151/73 mmHg  Pulse 78  Temp(Src) 97.6 F (36.4 C) (Tympanic)   There is no weight on file to calculate BMI.    ECOG FS:2 - Symptomatic, <50% confined to bed  General: Well-developed,  well-nourished, no acute distress. Patient in wheelchair today due to weakness from MS. Eyes: Pink conjunctiva, anicteric sclera. HEENT: Normocephalic, moist mucous membranes, clear oropharnyx. Lungs: Clear to auscultation bilaterally. Heart: Regular rate and rhythm. No rubs, murmurs, or gallops. Abdomen: Soft, nontender, nondistended. No organomegaly noted, normoactive bowel sounds. Breast: Patient deferred as she recently had exam with Dr. Renda Rolls.  Musculoskeletal: No edema, cyanosis, or clubbing. Neuro: Alert, answering all questions appropriately. Cranial nerves grossly intact. Bilateral lower extremity weakness.  Skin: No rashes or petechiae noted. Psych: Normal affect. Lymphatics: No cervical, clavicular LAD.   LAB RESULTS:  No visits with results within 3 Day(s) from this visit. Latest known visit with results is:  Admission on 09/08/2014, Discharged on 09/08/2014  Component Date Value Ref Range Status  . Sodium 09/08/2014 136  135 - 145 mmol/L Final  . Potassium 09/08/2014 3.5  3.5 - 5.1 mmol/L Final  . Chloride 09/08/2014 99* 101 - 111 mmol/L Final  . CO2 09/08/2014 27  22 - 32 mmol/L Final  . Glucose, Bld 09/08/2014 95  65 - 99 mg/dL Final  . BUN 69/62/9528 9  6 - 20 mg/dL Final  . Creatinine, Ser 09/08/2014 0.71  0.44 - 1.00 mg/dL Final  . Calcium 41/32/4401 8.9  8.9 - 10.3 mg/dL Final  . Total Protein 09/08/2014 7.4  6.5 - 8.1 g/dL Final  . Albumin 02/72/5366 4.0  3.5 - 5.0 g/dL Final  . AST 44/04/4740 29  15 - 41 U/L Final  . ALT 09/08/2014 22  14 - 54 U/L Final  . Alkaline Phosphatase 09/08/2014 78  38 - 126 U/L Final  . Total Bilirubin 09/08/2014 0.3  0.3 - 1.2 mg/dL Final  . GFR calc non Af Amer 09/08/2014 >60  >60 mL/min Final  . GFR calc Af Amer 09/08/2014 >60  >60 mL/min Final   Comment: (NOTE) The eGFR has been calculated using the CKD EPI equation. This calculation has not been validated in all clinical situations. eGFR's persistently <60 mL/min  signify possible Chronic Kidney Disease.   . Anion gap 09/08/2014 10  5 - 15 Final  . WBC 09/08/2014 5.3  3.6 - 11.0 K/uL Final  . RBC 09/08/2014 4.39  3.80 - 5.20 MIL/uL Final  . Hemoglobin 09/08/2014 12.7  12.0 - 16.0 g/dL Final  . HCT 59/56/3875 37.2  35.0 - 47.0 % Final  . MCV 09/08/2014 84.7  80.0 - 100.0 fL Final  . MCH 09/08/2014 28.9  26.0 - 34.0 pg Final  . MCHC 09/08/2014 34.2  32.0 - 36.0 g/dL Final  . RDW 64/33/2951 13.6  11.5 - 14.5 % Final  . Platelets 09/08/2014 226  150 - 440 K/uL Final    STUDIES: No results found.  ASSESSMENT:  Carcinoma of breast  PLAN:   1. Left breast cancer. Stage II a, T1 cN1 M0, status post wide local excision, adjuvant chemotherapy (chemotherapy was discontinued secondary to worsening MS symptoms) and radiation. Patient is currently  on tamoxifen. Hormone levels were checked at last visit and patient has progressed into menopause. We will change tamoxifen to letrozole 2.5 mg daily. Also advised to continue with daily calcium and vitamin D. She will complete 5 years of anti-hormone therapy in April 2021.  Advised patient that we would continue with follow-up in approximately 3 months to see how she is tolerating letrozole.  2. MS. Patient is currently receiving 500 mg of Rituxan every 4 months. In follows closely with her neurologist at [facility].   Discussed with patient's common side effects of aromatase inhibitors, she states understanding to call us if she experiences unusual or new weakness, myalgias, or arthralgias.  Patient expressed understanding and was in agreement with this plan. She also understands that She can call clinic at any time with any questions, concerns, or complaints.   Dr. Oliva Bustard was available for consultation and review of plan of care for this patient.  Breast CA [facility])   Staging form: Breast, AJCC 7th Edition     Clinical stage from 09/09/2014: Stage IIA (T1c, N1, M0) - Signed by [name], MD on  09/09/2014   Evlyn Kanner, NP   02/26/2015 11:25 AM

## 2015-04-20 ENCOUNTER — Inpatient Hospital Stay: Payer: BC Managed Care – PPO | Attending: Oncology

## 2015-04-20 DIAGNOSIS — C50912 Malignant neoplasm of unspecified site of left female breast: Secondary | ICD-10-CM | POA: Insufficient documentation

## 2015-04-20 DIAGNOSIS — Z452 Encounter for adjustment and management of vascular access device: Secondary | ICD-10-CM | POA: Insufficient documentation

## 2015-04-20 MED ORDER — HEPARIN SOD (PORK) LOCK FLUSH 100 UNIT/ML IV SOLN
500.0000 [IU] | Freq: Once | INTRAVENOUS | Status: AC
  Administered 2015-04-20: 500 [IU] via INTRAVENOUS

## 2015-04-20 MED ORDER — HEPARIN SOD (PORK) LOCK FLUSH 100 UNIT/ML IV SOLN
INTRAVENOUS | Status: AC
  Filled 2015-04-20: qty 5

## 2015-04-20 MED ORDER — SODIUM CHLORIDE 0.9% FLUSH
10.0000 mL | Freq: Once | INTRAVENOUS | Status: AC
  Administered 2015-04-20: 10 mL via INTRAVENOUS
  Filled 2015-04-20: qty 10

## 2015-05-25 ENCOUNTER — Other Ambulatory Visit: Payer: Self-pay | Admitting: Oncology

## 2015-05-25 DIAGNOSIS — C50912 Malignant neoplasm of unspecified site of left female breast: Secondary | ICD-10-CM

## 2015-05-28 ENCOUNTER — Inpatient Hospital Stay: Payer: BC Managed Care – PPO | Attending: Oncology

## 2015-05-28 ENCOUNTER — Inpatient Hospital Stay (HOSPITAL_BASED_OUTPATIENT_CLINIC_OR_DEPARTMENT_OTHER): Payer: BC Managed Care – PPO | Admitting: Oncology

## 2015-05-28 ENCOUNTER — Encounter: Payer: Self-pay | Admitting: Oncology

## 2015-05-28 VITALS — BP 120/78 | HR 93 | Temp 98.1°F | Resp 16

## 2015-05-28 DIAGNOSIS — Z170 Estrogen receptor positive status [ER+]: Secondary | ICD-10-CM

## 2015-05-28 DIAGNOSIS — Z9221 Personal history of antineoplastic chemotherapy: Secondary | ICD-10-CM | POA: Diagnosis not present

## 2015-05-28 DIAGNOSIS — C50412 Malignant neoplasm of upper-outer quadrant of left female breast: Secondary | ICD-10-CM

## 2015-05-28 DIAGNOSIS — Z79811 Long term (current) use of aromatase inhibitors: Secondary | ICD-10-CM

## 2015-05-28 DIAGNOSIS — G35 Multiple sclerosis: Secondary | ICD-10-CM | POA: Diagnosis not present

## 2015-05-28 DIAGNOSIS — C50912 Malignant neoplasm of unspecified site of left female breast: Secondary | ICD-10-CM

## 2015-05-28 NOTE — Progress Notes (Signed)
Patient would like to know when she can stop the Eliquis.  She is tolerating Letrozole.

## 2015-05-29 LAB — CANCER ANTIGEN 27.29: CA 27.29: 28.3 U/mL (ref 0.0–38.6)

## 2015-05-29 NOTE — Progress Notes (Signed)
[place]  Telephone:(336) [phone]  Fax:(336) [phone]     Rachel Hall DOB: [birth year]  MR#: [id number]  WNI#:[id number]  Patient Care Team: Kirk Ruths, MD as PCP - General (Internal Medicine)  CHIEF COMPLAINT:  Chief Complaint  Patient presents with  . Breast Cancer    INTERVAL HISTORY: Patient returns to clinic today for routine 3 month follow-up. She continues to have chronic weakness from her multiple sclerosis, but otherwise feels well. She is tolerating letrozole without significant side effects. She has no new neurologic complaints. She denies any recent fevers. She has no chest pain or shortness of breath. She has good appetite and denies weight loss. She denies any nausea, vomiting, constipation, or diarrhea. Patient feels at her baseline and offers no specific complaints today.     REVIEW OF SYSTEMS:   Review of Systems  Constitutional: Positive for fever and malaise/fatigue. Negative for weight loss.  Respiratory: Negative.  Negative for shortness of breath.   Cardiovascular: Negative.  Negative for chest pain.  Gastrointestinal: Negative.   Genitourinary: Negative.   Musculoskeletal: Negative.   Neurological: Positive for weakness.  Psychiatric/Behavioral: Negative.     As per HPI. Otherwise, a complete review of systems is negatve.  PAST MEDICAL HISTORY: Past Medical History  Diagnosis Date  . MS (multiple sclerosis) ([place])   . Hypertension   . Cancer [facility]) left     breast cancer  . Status post radiation therapy     breast cancer  . Status post chemotherapy     left breast cancer  . Post-menopausal     PAST SURGICAL HISTORY: Past Surgical History  Procedure Laterality Date  . Breast biopsy Left     positive 2015    FAMILY HISTORY Family History  Problem Relation Age of Onset  . Breast cancer Neg Hx     GYNECOLOGIC HISTORY:  No LMP recorded. Patient is postmenopausal.     ADVANCED DIRECTIVES:    HEALTH  MAINTENANCE: Social History  Substance Use Topics  . Smoking status: Never Smoker   . Smokeless tobacco: Not on file  . Alcohol Use: No     Colonoscopy:  PAP:  Bone density:  Lipid panel:  No Known Allergies  Current Outpatient Prescriptions  Medication Sig Dispense Refill  . acetaminophen (TYLENOL) 325 MG tablet Take by mouth.    Marland Kitchen amantadine (SYMMETREL) 100 MG capsule     . amLODipine (NORVASC) 5 MG tablet     . apixaban (ELIQUIS) 5 MG TABS tablet Take by mouth.    . baclofen (LIORESAL) 10 MG tablet     . Calcium Carbonate-Vitamin D 600-400 MG-UNIT per tablet Take by mouth.    Marland Kitchen FLUoxetine (PROZAC) 20 MG capsule     . letrozole (FEMARA) 2.5 MG tablet Take 1 tablet (2.5 mg total) by mouth daily. 30 tablet 3  . omeprazole (PRILOSEC) 20 MG capsule Take by mouth.    . oxybutynin (DITROPAN) 5 MG tablet     . oxyCODONE (OXY IR/ROXICODONE) 5 MG immediate release tablet     . tiZANidine (ZANAFLEX) 4 MG tablet Take by mouth.     No current facility-administered medications for this visit.   Facility-Administered Medications Ordered in Other Visits  Medication Dose Route Frequency Provider Last Rate Last Dose  . sodium chloride 0.9 % injection 10 mL  10 mL Intravenous PRN Forest Gleason, MD   10 mL at 06/23/14 1106    OBJECTIVE: BP 120/78 mmHg  Pulse 93  Temp(Src) 98.1  F (36.7 C) (Tympanic)  Resp 16   There is no weight on file to calculate BMI.    ECOG FS:1 - Symptomatic but completely ambulatory  General: Well-developed, well-nourished, no acute distress. Patient in wheelchair today due to weakness from [place]. Eyes: Pink conjunctiva, anicteric sclera. Lungs: Clear to auscultation bilaterally. Heart: Regular rate and rhythm. No rubs, murmurs, or gallops. Abdomen: Soft, nontender, nondistended. No organomegaly noted, normoactive bowel sounds. Breast: Patient requested exam be deferred today.  Musculoskeletal: No edema, cyanosis, or clubbing. Neuro: Alert, answering all  questions appropriately. Cranial nerves grossly intact. Bilateral lower extremity weakness.  Skin: No rashes or petechiae noted. Psych: Normal affect.    LAB RESULTS:  Appointment on 05/28/2015  Component Date Value Ref Range Status  . CA 27.29 05/28/2015 28.3  0.0 - 38.6 U/mL Final   Comment: (NOTE) [employer]/ACS methodology Performed At: [facility] [place], [place] [id number] Lindon Romp MD KK:[id number]     STUDIES: No results found.  ASSESSMENT:  ER/PR positive, HER-2 negative stage IIa adenocarcinoma of the left breast  PLAN:    1. Breast cancer: Patient is status post lumpectomy, adjuvant chemotherapy, and XRT. She completed her chemotherapy in approximately March 2016. Treatment was discontinued early secondary to worsening symptoms of her multiple sclerosis. Patient was initially on tamoxifen, but recently switched to letrozole since she had progressed into menopause. Continue  Treatment for total 5 years completing in April 2021. Patient will require a bone mineral density in the next 1-2 weeks. Her next mammogram will be scheduled in August 2017. Return to clinic in 6 months with repeat laboratory work and further evaluation.  2. MS. Patient is currently receiving 500 mg of Rituxan every 4 months. In follows closely with her neurologist at [facility].   Patient expressed understanding and was in agreement with this plan. She also understands that She can call clinic at any time with any questions, concerns, or complaints.    Breast CA [facility])   Staging form: Breast, AJCC 7th Edition     Clinical stage from 09/09/2014: Stage IIA (T1c, N1, M0) - Signed by [name], MD on 09/09/2014   [name], MD   05/29/2015 10:45 AM

## 2015-06-14 ENCOUNTER — Inpatient Hospital Stay: Payer: BC Managed Care – PPO | Attending: Oncology

## 2015-06-14 ENCOUNTER — Ambulatory Visit
Admission: RE | Admit: 2015-06-14 | Discharge: 2015-06-14 | Disposition: A | Discharge status: Home / Self Care | Payer: BC Managed Care – PPO | Source: Ambulatory Visit | Attending: Radiation Oncology | Admitting: Radiation Oncology

## 2015-06-14 ENCOUNTER — Ambulatory Visit
Admission: RE | Admit: 2015-06-14 | Discharge: 2015-06-14 | Disposition: A | Discharge status: Home / Self Care | Payer: BC Managed Care – PPO | Source: Ambulatory Visit | Attending: Oncology | Admitting: Oncology

## 2015-06-14 ENCOUNTER — Encounter: Payer: Self-pay | Admitting: Radiation Oncology

## 2015-06-14 VITALS — BP 128/83 | HR 85 | Temp 96.4°F | Resp 18

## 2015-06-14 DIAGNOSIS — C801 Malignant (primary) neoplasm, unspecified: Secondary | ICD-10-CM

## 2015-06-14 DIAGNOSIS — C50412 Malignant neoplasm of upper-outer quadrant of left female breast: Secondary | ICD-10-CM | POA: Insufficient documentation

## 2015-06-14 DIAGNOSIS — C50912 Malignant neoplasm of unspecified site of left female breast: Secondary | ICD-10-CM

## 2015-06-14 DIAGNOSIS — Z853 Personal history of malignant neoplasm of breast: Secondary | ICD-10-CM | POA: Insufficient documentation

## 2015-06-14 DIAGNOSIS — Z170 Estrogen receptor positive status [ER+]: Secondary | ICD-10-CM | POA: Diagnosis not present

## 2015-06-14 DIAGNOSIS — Z13820 Encounter for screening for osteoporosis: Secondary | ICD-10-CM | POA: Insufficient documentation

## 2015-06-14 DIAGNOSIS — M8580 Other specified disorders of bone density and structure, unspecified site: Secondary | ICD-10-CM | POA: Diagnosis not present

## 2015-06-14 DIAGNOSIS — Z452 Encounter for adjustment and management of vascular access device: Secondary | ICD-10-CM | POA: Insufficient documentation

## 2015-06-14 HISTORY — DX: Malignant neoplasm of unspecified site of unspecified female breast: C50.919

## 2015-06-14 MED ORDER — SODIUM CHLORIDE 0.9% FLUSH
10.0000 mL | INTRAVENOUS | Status: DC | PRN
  Administered 2015-06-14: 10 mL via INTRAVENOUS
  Filled 2015-06-14: qty 10

## 2015-06-14 MED ORDER — HEPARIN SOD (PORK) LOCK FLUSH 100 UNIT/ML IV SOLN
500.0000 [IU] | Freq: Once | INTRAVENOUS | Status: AC
  Administered 2015-06-14: 500 [IU] via INTRAVENOUS
  Filled 2015-06-14: qty 5

## 2015-06-14 NOTE — Progress Notes (Signed)
Radiation Oncology Follow up Note  Name: Rachel Hall   Date:   06/14/2015 MRN:  [id number] DOB: February 02, 1966    This 50 y.o. 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  REFERRING PROVIDER: Kirk Ruths, MD  HPI: Patient is a 50 year old female with multiple sclerosis who is now 1 year out having completed radiation therapy to her left breast for a stage IIa (T1 cN1 M0). She had a 1.6 cm tumor with a macro metastatic focus in one sentinel lymph node. Tumor was ER/PR positive HER-2/neu not overexpressed sheet adjuvant chemotherapy plus whole breast radiation. As well as peripheral lymphatics. Seen today in routine follow-up she is doing well. She specifically denies breast tenderness cough or bone pain. Her most recent mammogram was back in August 2016 she scheduled for another mammogram in August 2017. Last mammogram was BI-RADS 2. She specifically denies breast tenderness cough or bone pain. She is currently on letrozole tolerating that well without side effect. Her multiple sclerosis is being followed at [facility] she's currently on Rituxan every 4 months.  COMPLICATIONS OF TREATMENT: none  FOLLOW UP COMPLIANCE: keeps appointments   PHYSICAL EXAM:  BP 128/83 mmHg  Pulse 85  Temp(Src) 96.4 F (35.8 C)  Resp 18 Lungs are clear to A&P cardiac examination essentially unremarkable with regular rate and rhythm. No dominant mass or nodularity is noted in either breast in 2 positions examined. Incision is well-healed. No axillary or supraclavicular adenopathy is appreciated. Cosmetic result is excellent. Well-developed well-nourished patient in NAD. HEENT reveals PERLA, EOMI, discs not visualized.  Oral cavity is clear. No oral mucosal lesions are identified. Neck is clear without evidence of cervical or supraclavicular adenopathy.  Lungs are clear to A&P. Cardiac examination is essentially unremarkable with regular rate and rhythm without murmur rub or thrill. Abdomen is benign with no organomegaly or masses noted. Motor sensory and DTR levels are equal and symmetric in the upper and lower extremities. Cranial nerves II through XII are grossly intact. Proprioception is intact. No peripheral adenopathy or edema is identified. No motor or sensory levels are noted. Crude visual fields are within normal range.  RADIOLOGY RESULTS: Mammograms are reviewed compatible with the above-stated findings  PLAN: Present time she continues to do well 1 year out with no evidence of disease. I'm please were overall progress. I've asked to see her back in 1 year for follow-up. She continues on letrozole without side effect. Patient is to call with any concerns.  I would like to take this opportunity for allowing me to participate in the care of your patient.Armstead Peaks., MD

## 2015-07-09 ENCOUNTER — Other Ambulatory Visit: Payer: Self-pay | Admitting: Oncology

## 2015-07-09 DIAGNOSIS — M810 Age-related osteoporosis without current pathological fracture: Secondary | ICD-10-CM | POA: Insufficient documentation

## 2015-07-09 MED ORDER — ALENDRONATE SODIUM 70 MG PO TABS: 70.0000 mg | ORAL_TABLET | ORAL | Status: DC

## 2015-07-17 ENCOUNTER — Other Ambulatory Visit: Payer: Self-pay | Admitting: Family Medicine

## 2015-09-21 ENCOUNTER — Other Ambulatory Visit: Payer: Self-pay | Admitting: Oncology

## 2015-09-21 ENCOUNTER — Ambulatory Visit
Admission: RE | Admit: 2015-09-21 | Discharge: 2015-09-21 | Disposition: A | Discharge status: Home / Self Care | Payer: BC Managed Care – PPO | Source: Ambulatory Visit | Attending: Oncology | Admitting: Oncology

## 2015-09-21 DIAGNOSIS — C50912 Malignant neoplasm of unspecified site of left female breast: Secondary | ICD-10-CM

## 2015-11-25 NOTE — Progress Notes (Signed)
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  INTERVAL HISTORY: Patient returns to clinic today for routine 6 month follow-up. She continues to have chronic weakness from her multiple sclerosis, but otherwise feels well. She is tolerating letrozole without significant side effects. She has no new neurologic complaints. She denies any recent fevers. She has no chest pain or shortness of breath. She has good appetite and denies weight loss. She denies any nausea, vomiting, constipation, or diarrhea. Patient feels at her baseline and offers no specific complaints today.     REVIEW OF SYSTEMS:   Review of Systems  Constitutional: Positive for malaise/fatigue. Negative for fever and weight loss.  Respiratory: Negative.  Negative for cough and shortness of breath.   Cardiovascular: Negative.  Negative for chest pain and leg swelling.  Gastrointestinal: Negative.  Negative for abdominal pain.  Genitourinary: Negative.   Musculoskeletal: Negative.   Neurological: Positive for tremors, sensory change, focal weakness and weakness.  Psychiatric/Behavioral: Negative.  Negative for depression. The patient is not nervous/anxious.     As per HPI. Otherwise, a complete review of systems is negative.  PAST MEDICAL HISTORY: Past Medical History:  Diagnosis Date  . Breast cancer ([place]) 2015   left breast cancer  . Cancer [facility]) left    breast cancer  . Hypertension   . MS (multiple sclerosis) ([place])   . Post-menopausal   . Status post chemotherapy    left breast cancer  . Status post radiation therapy    breast cancer    PAST SURGICAL HISTORY: Past Surgical History:  Procedure Laterality Date    . BREAST BIOPSY Left    positive 2015    FAMILY HISTORY Family History  Problem Relation Age of Onset  . Breast cancer Neg Hx     GYNECOLOGIC HISTORY:  No LMP recorded. Patient is postmenopausal.     ADVANCED DIRECTIVES:    HEALTH MAINTENANCE: Social History  Substance Use Topics  . Smoking status: Never Smoker  . Smokeless tobacco: Not on file  . Alcohol use No     Colonoscopy:  PAP:  Bone density:  Lipid panel:  No Known Allergies  Current Outpatient Prescriptions  Medication Sig Dispense Refill  . acetaminophen (TYLENOL) 325 MG tablet Take by mouth.    Marland Kitchen alendronate (FOSAMAX) 70 MG tablet Take 1 tablet (70 mg total) by mouth once a week. Take with a full glass of water on an empty stomach. 12 tablet 4  . amantadine (SYMMETREL) 100 MG capsule Take 100 mg by mouth daily.     Marland Kitchen amLODipine (NORVASC) 5 MG tablet     . apixaban (ELIQUIS) 5 MG TABS tablet Take 5 mg by mouth 2 (two) times daily.     . baclofen (LIORESAL) 10 MG tablet Take 10 mg by mouth.     . Calcium Carbonate-Vitamin D 600-400 MG-UNIT per tablet Take 1 tablet by mouth 2 (two) times daily.     Marland Kitchen FLUoxetine (PROZAC) 20 MG capsule Take 20 mg by mouth daily.     Marland Kitchen letrozole (FEMARA) 2.5 MG tablet TAKE ONE (1) TABLET EACH DAY 30 tablet 5  . omeprazole (PRILOSEC) 20 MG capsule Take 20 mg by mouth daily.     Marland Kitchen  oxybutynin (DITROPAN) 5 MG tablet Take 5 mg by mouth 2 (two) times daily.     Marland Kitchen oxyCODONE (OXY IR/ROXICODONE) 5 MG immediate release tablet Take 5 mg by mouth every 6 (six) hours as needed.     Marland Kitchen tiZANidine (ZANAFLEX) 4 MG tablet Take 4 mg by mouth every 6 (six) hours as needed.      No current facility-administered medications for this visit.    Facility-Administered Medications Ordered in Other Visits  Medication Dose Route Frequency Provider Last Rate Last Dose  . sodium chloride 0.9 % injection 10 mL  10 mL Intravenous PRN Forest Gleason, MD   10 mL at 06/23/14 1106    OBJECTIVE: BP 137/87 (BP  Location: Right Arm, Patient Position: Sitting)   Pulse 87   Temp 98.7 F (37.1 C) (Tympanic)   Resp 18   Wt 124 lb 3.7 oz (56.4 kg)   BMI 22.72 kg/m    Body mass index is 22.72 kg/m.    ECOG FS:2 - Symptomatic, <50% confined to bed  General: Well-developed, well-nourished, no acute distress. Patient in wheelchair today due to weakness from [place]. Eyes: Pink conjunctiva, anicteric sclera. Lungs: Clear to auscultation bilaterally. Heart: Regular rate and rhythm. No rubs, murmurs, or gallops. Abdomen: Soft, nontender, nondistended. No organomegaly noted, normoactive bowel sounds. Breast: Patient requested exam be deferred today.  Musculoskeletal: No edema, cyanosis, or clubbing. Neuro: Alert, answering all questions appropriately. Cranial nerves grossly intact. Bilateral lower extremity weakness.  Skin: No rashes or petechiae noted. Psych: Normal affect.    LAB RESULTS:  No visits with results within 3 Day(s) from this visit.  Latest known visit with results is:  Appointment on 05/28/2015  Component Date Value Ref Range Status  . CA 27.29 05/29/2015 28.3  0.0 - 38.6 U/mL Final   Comment: (NOTE) Bayer Centaur/ACS methodology Performed At: [facility] [place], [place] [id number] Lindon Romp MD IR:[id number]     STUDIES: No results found.  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 She completed her chemotherapy in approximately March 2016. Treatment was discontinued early secondary to worsening symptoms of her multiple sclerosis. Patient was initially on tamoxifen, but recently switched to letrozole since she had progressed into menopause. Continue treatment for total 5 years completing in April  2021. Her most recent mammogram on September 21, 2015 was reported as BI-RADS 2. Repeat in August 2018. Return to clinic in 6 months for routine evaluation. 2. Multiple sclerosis: Patient is currently receiving 500 mg of Rituxan every 4 months. Continue close follow-up with [place] neurology. 3. Osteoporosis: Patient's most recent bone marrow density on Jun 14, 2015 for a T score of -3.0. She has been initiated on Fosamax once weekly and has been instructed to continue her calcium and vitamin D as prescribed. 4. Port: Patient has been inquired about removing her port, but ultimately decided to keep it in to continue using it for her Rituxan infusions.  Patient expressed understanding and was in agreement with this plan. She also understands that She can call clinic at any time with any questions, concerns, or complaints.    Breast CA [facility])   Staging form: Breast, AJCC 7th Edition     Clinical stage from 09/09/2014: Stage IIA (T1c, N1,  M0) - Signed by [name], MD on 09/09/2014   [name], MD   11/26/2015 8:39 PM

## 2015-11-26 ENCOUNTER — Inpatient Hospital Stay: Payer: BC Managed Care – PPO | Attending: Oncology

## 2015-11-26 ENCOUNTER — Inpatient Hospital Stay (HOSPITAL_BASED_OUTPATIENT_CLINIC_OR_DEPARTMENT_OTHER): Payer: BC Managed Care – PPO | Admitting: Oncology

## 2015-11-26 VITALS — BP 137/87 | HR 87 | Temp 98.7°F | Resp 18 | Wt 124.2 lb

## 2015-11-26 DIAGNOSIS — Z7901 Long term (current) use of anticoagulants: Secondary | ICD-10-CM

## 2015-11-26 DIAGNOSIS — Z79899 Other long term (current) drug therapy: Secondary | ICD-10-CM | POA: Insufficient documentation

## 2015-11-26 DIAGNOSIS — R251 Tremor, unspecified: Secondary | ICD-10-CM | POA: Insufficient documentation

## 2015-11-26 DIAGNOSIS — Z452 Encounter for adjustment and management of vascular access device: Secondary | ICD-10-CM | POA: Diagnosis not present

## 2015-11-26 DIAGNOSIS — M818 Other osteoporosis without current pathological fracture: Secondary | ICD-10-CM

## 2015-11-26 DIAGNOSIS — R5383 Other fatigue: Secondary | ICD-10-CM

## 2015-11-26 DIAGNOSIS — G35 Multiple sclerosis: Secondary | ICD-10-CM | POA: Insufficient documentation

## 2015-11-26 DIAGNOSIS — Z79811 Long term (current) use of aromatase inhibitors: Secondary | ICD-10-CM | POA: Diagnosis not present

## 2015-11-26 DIAGNOSIS — Z170 Estrogen receptor positive status [ER+]: Secondary | ICD-10-CM | POA: Insufficient documentation

## 2015-11-26 DIAGNOSIS — R531 Weakness: Secondary | ICD-10-CM

## 2015-11-26 DIAGNOSIS — C50912 Malignant neoplasm of unspecified site of left female breast: Secondary | ICD-10-CM

## 2015-11-26 DIAGNOSIS — Z9221 Personal history of antineoplastic chemotherapy: Secondary | ICD-10-CM

## 2015-11-26 DIAGNOSIS — Z923 Personal history of irradiation: Secondary | ICD-10-CM | POA: Insufficient documentation

## 2015-11-26 DIAGNOSIS — I10 Essential (primary) hypertension: Secondary | ICD-10-CM | POA: Insufficient documentation

## 2015-11-26 DIAGNOSIS — C50412 Malignant neoplasm of upper-outer quadrant of left female breast: Secondary | ICD-10-CM | POA: Insufficient documentation

## 2015-11-26 NOTE — Progress Notes (Signed)
States is feeling well. Offers no complaints. 

## 2015-11-27 LAB — CANCER ANTIGEN 27.29: CA 27.29: 24.1 U/mL (ref 0.0–38.6)

## 2015-11-28 ENCOUNTER — Inpatient Hospital Stay: Payer: BC Managed Care – PPO

## 2015-11-28 DIAGNOSIS — Z95828 Presence of other vascular implants and grafts: Secondary | ICD-10-CM

## 2015-11-28 DIAGNOSIS — C50412 Malignant neoplasm of upper-outer quadrant of left female breast: Secondary | ICD-10-CM | POA: Diagnosis not present

## 2015-11-28 MED ORDER — HEPARIN SOD (PORK) LOCK FLUSH 100 UNIT/ML IV SOLN
500.0000 [IU] | Freq: Once | INTRAVENOUS | Status: AC
  Administered 2015-11-28: 500 [IU] via INTRAVENOUS

## 2015-11-28 MED ORDER — HEPARIN SOD (PORK) LOCK FLUSH 100 UNIT/ML IV SOLN
INTRAVENOUS | Status: AC
  Filled 2015-11-28: qty 5

## 2015-11-28 MED ORDER — SODIUM CHLORIDE 0.9% FLUSH
10.0000 mL | Freq: Once | INTRAVENOUS | Status: AC
  Administered 2015-11-28: 10 mL via INTRAVENOUS
  Filled 2015-11-28: qty 10

## 2016-01-09 ENCOUNTER — Other Ambulatory Visit: Payer: Self-pay | Admitting: *Deleted

## 2016-01-09 MED ORDER — LETROZOLE 2.5 MG PO TABS: ORAL_TABLET | ORAL | 5 refills | Status: DC

## 2016-05-26 ENCOUNTER — Inpatient Hospital Stay: Payer: BC Managed Care – PPO

## 2016-05-26 ENCOUNTER — Inpatient Hospital Stay: Payer: BC Managed Care – PPO | Attending: Oncology | Admitting: Oncology

## 2016-05-26 VITALS — BP 134/81 | HR 97 | Temp 98.4°F | Resp 18 | Wt 116.3 lb

## 2016-05-26 DIAGNOSIS — Z923 Personal history of irradiation: Secondary | ICD-10-CM | POA: Insufficient documentation

## 2016-05-26 DIAGNOSIS — Z9221 Personal history of antineoplastic chemotherapy: Secondary | ICD-10-CM | POA: Diagnosis not present

## 2016-05-26 DIAGNOSIS — R5383 Other fatigue: Secondary | ICD-10-CM | POA: Insufficient documentation

## 2016-05-26 DIAGNOSIS — C50412 Malignant neoplasm of upper-outer quadrant of left female breast: Secondary | ICD-10-CM | POA: Insufficient documentation

## 2016-05-26 DIAGNOSIS — R531 Weakness: Secondary | ICD-10-CM | POA: Diagnosis not present

## 2016-05-26 DIAGNOSIS — I10 Essential (primary) hypertension: Secondary | ICD-10-CM | POA: Insufficient documentation

## 2016-05-26 DIAGNOSIS — Z79899 Other long term (current) drug therapy: Secondary | ICD-10-CM

## 2016-05-26 DIAGNOSIS — Z170 Estrogen receptor positive status [ER+]: Secondary | ICD-10-CM | POA: Diagnosis not present

## 2016-05-26 DIAGNOSIS — M818 Other osteoporosis without current pathological fracture: Secondary | ICD-10-CM | POA: Diagnosis not present

## 2016-05-26 DIAGNOSIS — Z79811 Long term (current) use of aromatase inhibitors: Secondary | ICD-10-CM | POA: Insufficient documentation

## 2016-05-26 DIAGNOSIS — G35 Multiple sclerosis: Secondary | ICD-10-CM | POA: Diagnosis not present

## 2016-05-26 NOTE — Progress Notes (Signed)
Offers no complaints  

## 2016-05-26 NOTE — Progress Notes (Signed)
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  INTERVAL HISTORY: Patient returns to clinic today for routine 6 month follow-up. She continues to have chronic weakness from her multiple sclerosis, but otherwise feels well. She is tolerating letrozole without significant side effects. She has no new neurologic complaints. She denies any recent fevers. She has no chest pain or shortness of breath. She has a good appetite and denies weight loss. She denies any nausea, vomiting, constipation, or diarrhea. She has no urinary complaints. Patient feels at her baseline and offers no specific complaints today.     REVIEW OF SYSTEMS:   Review of Systems  Constitutional: Positive for malaise/fatigue. Negative for fever and weight loss.  Respiratory: Negative.  Negative for cough and shortness of breath.   Cardiovascular: Negative.  Negative for chest pain and leg swelling.  Gastrointestinal: Negative.  Negative for abdominal pain.  Genitourinary: Negative.   Musculoskeletal: Negative.   Neurological: Positive for tremors, sensory change, focal weakness and weakness.  Psychiatric/Behavioral: Negative.  Negative for depression. The patient is not nervous/anxious.     As per HPI. Otherwise, a complete review of systems is negative.  PAST MEDICAL HISTORY: Past Medical History:  Diagnosis Date  . Breast cancer ([place]) 2015   left breast cancer  . Cancer [facility]) left    breast cancer  . Hypertension   . MS (multiple sclerosis) ([place])   . Post-menopausal   . Status post chemotherapy    left breast cancer  . Status post radiation therapy    breast cancer    PAST SURGICAL HISTORY: Past Surgical  History:  Procedure Laterality Date  . BREAST BIOPSY Left    positive 2015    FAMILY HISTORY Family History  Problem Relation Age of Onset  . Breast cancer Neg Hx     GYNECOLOGIC HISTORY:  No LMP recorded. Patient is postmenopausal.     ADVANCED DIRECTIVES:    HEALTH MAINTENANCE: Social History  Substance Use Topics  . Smoking status: Never Smoker  . Smokeless tobacco: Not on file  . Alcohol use No     Colonoscopy:  PAP:  Bone density:  Lipid panel:  No Known Allergies  Current Outpatient Prescriptions  Medication Sig Dispense Refill  . acetaminophen (TYLENOL) 325 MG tablet Take by mouth.    Marland Kitchen alendronate (FOSAMAX) 70 MG tablet Take 1 tablet (70 mg total) by mouth once a week. Take with a full glass of water on an empty stomach. 12 tablet 4  . amantadine (SYMMETREL) 100 MG capsule Take 100 mg by mouth daily.     Marland Kitchen amLODipine (NORVASC) 5 MG tablet     . apixaban (ELIQUIS) 5 MG TABS tablet Take 5 mg by mouth 2 (two) times daily.     . baclofen (LIORESAL) 10 MG tablet Take 10 mg by mouth.     . Calcium Carbonate-Vitamin D 600-400 MG-UNIT per tablet Take 1 tablet by mouth 2 (two) times daily.     Marland Kitchen FLUoxetine (PROZAC) 20 MG capsule Take 20 mg by mouth daily.     Marland Kitchen letrozole (FEMARA) 2.5 MG tablet TAKE ONE (1) TABLET EACH DAY 30 tablet 5  . omeprazole (PRILOSEC) 20 MG capsule Take 20 mg by mouth  daily.     . oxybutynin (DITROPAN) 5 MG tablet Take 5 mg by mouth 2 (two) times daily.     Marland Kitchen oxyCODONE (OXY IR/ROXICODONE) 5 MG immediate release tablet Take 5 mg by mouth every 6 (six) hours as needed.     Marland Kitchen tiZANidine (ZANAFLEX) 4 MG tablet Take 4 mg by mouth every 6 (six) hours as needed.      No current facility-administered medications for this visit.    Facility-Administered Medications Ordered in Other Visits  Medication Dose Route Frequency Provider Last Rate Last Dose  . sodium chloride 0.9 % injection 10 mL  10 mL Intravenous PRN Forest Gleason, MD   10 mL at 06/23/14  1106    OBJECTIVE: BP 134/81   Pulse 97   Temp 98.4 F (36.9 C) (Tympanic)   Resp 18   Wt 116 lb 4.8 oz (52.8 kg)   BMI 21.27 kg/m    Body mass index is 21.27 kg/m.    ECOG FS:2 - Symptomatic, <50% confined to bed  General: Well-developed, well-nourished, no acute distress. Patient in wheelchair today due to weakness from [place]. Eyes: Pink conjunctiva, anicteric sclera. Lungs: Clear to auscultation bilaterally. Heart: Regular rate and rhythm. No rubs, murmurs, or gallops. Abdomen: Soft, nontender, nondistended. No organomegaly noted, normoactive bowel sounds. Breast: Patient requested exam be deferred today.  Musculoskeletal: No edema, cyanosis, or clubbing. Neuro: Alert, answering all questions appropriately. Cranial nerves grossly intact. Bilateral lower extremity weakness.  Skin: No rashes or petechiae noted. Psych: Normal affect.    LAB RESULTS:  Appointment on 05/26/2016  Component Date Value Ref Range Status  . CA 27.29 05/26/2016 27.4  0.0 - 38.6 U/mL Final   Comment: (NOTE) Bayer Centaur/ACS methodology Performed At: [facility] [place], [place] [id number] Lindon Romp MD HT:[id number]     STUDIES: No results found.  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 She completed her chemotherapy in approximately March 2016. Treatment was discontinued early secondary to worsening symptoms of her multiple sclerosis. Patient was initially on tamoxifen, but recently switched to letrozole since she had progressed into menopause. Continue treatment for total 5 years completing in April 2021. Her most recent mammogram on September 21, 2015 was reported as BI-RADS 2. Repeat in August 2018. Return to  clinic in 6 months for routine evaluation. 2. Multiple sclerosis: Patient is currently receiving 500 mg of Rituxan every 4 months. Continue close follow-up with [place] neurology. 3. Osteoporosis: Patient's most recent bone marrow density on Jun 14, 2015 for a T score of -3.0. She has been initiated on Fosamax once weekly and has been instructed to continue her calcium and vitamin D as prescribed. Repeat bone mineral density in May 2018. 4. Port: Patient has been inquired about removing her port, but ultimately decided to keep it in to continue using it for her Rituxan infusions. Continue port flush every 6 weeks.  Patient expressed understanding and was in agreement with this plan. She also understands that She can call clinic at any time with any questions, concerns, or complaints.    Breast CA [facility])   Staging form: Breast, AJCC 7th Edition     Clinical stage from 09/09/2014: Stage IIA (T1c, N1, M0) - Signed by Kathlene November  Grayland Ormond, MD on 09/09/2014   [name], MD   06/01/2016 8:15 AM

## 2016-05-27 ENCOUNTER — Other Ambulatory Visit: Payer: Self-pay | Admitting: *Deleted

## 2016-05-27 DIAGNOSIS — C50412 Malignant neoplasm of upper-outer quadrant of left female breast: Secondary | ICD-10-CM

## 2016-05-27 LAB — CANCER ANTIGEN 27.29: CA 27.29: 27.4 U/mL (ref 0.0–38.6)

## 2016-05-28 ENCOUNTER — Other Ambulatory Visit: Payer: Self-pay

## 2016-05-28 DIAGNOSIS — C50412 Malignant neoplasm of upper-outer quadrant of left female breast: Secondary | ICD-10-CM

## 2016-06-12 ENCOUNTER — Other Ambulatory Visit: Payer: Self-pay | Admitting: *Deleted

## 2016-06-12 ENCOUNTER — Inpatient Hospital Stay: Payer: BC Managed Care – PPO | Attending: Oncology

## 2016-06-12 ENCOUNTER — Ambulatory Visit
Admission: RE | Admit: 2016-06-12 | Discharge: 2016-06-12 | Disposition: A | Discharge status: Home / Self Care | Payer: BC Managed Care – PPO | Source: Ambulatory Visit | Attending: Radiation Oncology | Admitting: Radiation Oncology

## 2016-06-12 ENCOUNTER — Encounter: Payer: Self-pay | Admitting: Radiation Oncology

## 2016-06-12 VITALS — BP 134/75 | HR 85 | Temp 97.9°F

## 2016-06-12 DIAGNOSIS — Z923 Personal history of irradiation: Secondary | ICD-10-CM | POA: Diagnosis not present

## 2016-06-12 DIAGNOSIS — Z452 Encounter for adjustment and management of vascular access device: Secondary | ICD-10-CM | POA: Diagnosis not present

## 2016-06-12 DIAGNOSIS — Z79811 Long term (current) use of aromatase inhibitors: Secondary | ICD-10-CM | POA: Diagnosis not present

## 2016-06-12 DIAGNOSIS — C50412 Malignant neoplasm of upper-outer quadrant of left female breast: Secondary | ICD-10-CM | POA: Insufficient documentation

## 2016-06-12 DIAGNOSIS — G35 Multiple sclerosis: Secondary | ICD-10-CM | POA: Insufficient documentation

## 2016-06-12 DIAGNOSIS — Z95828 Presence of other vascular implants and grafts: Secondary | ICD-10-CM

## 2016-06-12 DIAGNOSIS — Z170 Estrogen receptor positive status [ER+]: Secondary | ICD-10-CM | POA: Insufficient documentation

## 2016-06-12 MED ORDER — HEPARIN SOD (PORK) LOCK FLUSH 100 UNIT/ML IV SOLN
500.0000 [IU] | Freq: Once | INTRAVENOUS | Status: AC
  Administered 2016-06-12: 500 [IU] via INTRAVENOUS

## 2016-06-12 MED ORDER — AZITHROMYCIN 250 MG PO TABS: ORAL_TABLET | ORAL | 0 refills | Status: DC

## 2016-06-12 MED ORDER — SODIUM CHLORIDE 0.9% FLUSH
10.0000 mL | INTRAVENOUS | Status: DC | PRN
  Administered 2016-06-12: 10 mL via INTRAVENOUS
  Filled 2016-06-12: qty 10

## 2016-06-12 NOTE — Progress Notes (Signed)
Radiation Oncology Follow up Note  Name: Rachel Hall   Date:   06/12/2016 MRN:  [id number] DOB: 1965-09-13    This 51 y.o. female presents to the clinic today for a 2 year follow-up status post whole breast radiation to her left breast for stage II invasive mammary carcinoma.  REFERRING PROVIDER: Kirk Ruths, MD  HPI: Patient is a 51 year old female now out 2 years having pleated radiation therapy to her left breast for stage IIa (T1 cN1 M0) ER/PR positive HER-2/neu negative invasive mammary carcinoma. She received radiation therapy to her whole breast and peripheral lymphatics. She seen today in routine follow-up and is doing well she does have multiple sclerosis is wheelchair-bound. She specifically denies breast tenderness cough or bone pain.. She is currently on Femara tolerating that well without side effect.  COMPLICATIONS OF TREATMENT: none  FOLLOW UP COMPLIANCE: keeps appointments   PHYSICAL EXAM:  BP 134/75   Pulse 85   Temp 97.9 F (36.6 C)  Lungs are clear to A&P cardiac examination essentially unremarkable with regular rate and rhythm. No dominant mass or nodularity is noted in either breast in 2 positions examined. Incision is well-healed. No axillary or supraclavicular adenopathy is appreciated. Cosmetic result is excellent. Well-developed well-nourished patient in NAD. HEENT reveals PERLA, EOMI, discs not visualized.  Oral cavity is clear. No oral mucosal lesions are identified. Neck is clear without evidence of cervical or supraclavicular adenopathy. Lungs are clear to A&P. Cardiac examination is essentially unremarkable with regular rate and rhythm without murmur rub or thrill. Abdomen is benign with no organomegaly or masses noted. Motor sensory and DTR levels are equal and symmetric in the upper and lower extremities. Cranial nerves II through XII are grossly intact. Proprioception is intact. No peripheral adenopathy or edema is identified. No motor or sensory  levels are noted. Crude visual fields are within normal range.  RADIOLOGY RESULTS: Her last mammograms were in August 2017 scheduled for repeat mammograms August 2018  PLAN: Present time she continues to do well with no evidence of disease. Based on her difficulty with multiple sclerosis I'm going to turn follow-up care over to medical oncology and surgeon. I would be happy to reevaluate the patient any time should radiation collagen consultation be indicated.  I would like to take this opportunity to thank you for allowing me to participate in the care of your patient.Armstead Peaks., MD

## 2016-06-25 ENCOUNTER — Ambulatory Visit
Admission: RE | Admit: 2016-06-25 | Discharge: 2016-06-25 | Disposition: A | Discharge status: Home / Self Care | Payer: BC Managed Care – PPO | Source: Ambulatory Visit | Attending: Oncology | Admitting: Oncology

## 2016-06-25 DIAGNOSIS — G35 Multiple sclerosis: Secondary | ICD-10-CM | POA: Diagnosis present

## 2016-06-25 DIAGNOSIS — M8588 Other specified disorders of bone density and structure, other site: Secondary | ICD-10-CM | POA: Diagnosis not present

## 2016-06-25 DIAGNOSIS — C50412 Malignant neoplasm of upper-outer quadrant of left female breast: Secondary | ICD-10-CM

## 2016-07-11 ENCOUNTER — Other Ambulatory Visit: Payer: Self-pay | Admitting: Oncology

## 2016-08-27 ENCOUNTER — Other Ambulatory Visit: Payer: Self-pay | Admitting: Oncology

## 2016-09-22 ENCOUNTER — Ambulatory Visit
Admission: RE | Admit: 2016-09-22 | Discharge: 2016-09-22 | Disposition: A | Discharge status: Home / Self Care | Payer: BC Managed Care – PPO | Source: Ambulatory Visit | Attending: Oncology | Admitting: Oncology

## 2016-09-22 DIAGNOSIS — G35 Multiple sclerosis: Secondary | ICD-10-CM

## 2016-09-22 DIAGNOSIS — C50412 Malignant neoplasm of upper-outer quadrant of left female breast: Secondary | ICD-10-CM

## 2016-11-24 ENCOUNTER — Other Ambulatory Visit: Payer: BC Managed Care – PPO

## 2016-11-24 ENCOUNTER — Ambulatory Visit: Payer: BC Managed Care – PPO | Admitting: Oncology

## 2016-11-25 NOTE — Progress Notes (Signed)
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  INTERVAL HISTORY: Patient returns to clinic today for routine 6 month follow-up. She continues to have chronic weakness from her multiple sclerosis, but otherwise feels well. She is tolerating letrozole and Fosamax without significant side effects. She has no new neurologic complaints. She denies any recent fevers. She has no chest pain or shortness of breath. She has a good appetite and denies weight loss. She denies any nausea, vomiting, constipation, or diarrhea. She has no urinary complaints. Patient feels at her baseline and offers no specific complaints today.     REVIEW OF SYSTEMS:   Review of Systems  Constitutional: Positive for malaise/fatigue. Negative for fever and weight loss.  Respiratory: Negative.  Negative for cough and shortness of breath.   Cardiovascular: Negative.  Negative for chest pain and leg swelling.  Gastrointestinal: Negative.  Negative for abdominal pain.  Genitourinary: Negative.   Musculoskeletal: Negative.   Neurological: Positive for tremors, sensory change, focal weakness and weakness.  Psychiatric/Behavioral: Negative.  Negative for depression. The patient is not nervous/anxious.     As per HPI. Otherwise, a complete review of systems is negative.  PAST MEDICAL HISTORY: Past Medical History:  Diagnosis Date  . Breast cancer ([place]) 2015   left breast cancer  . Cancer [facility]) left    breast cancer  . Hypertension   . MS (multiple sclerosis) ([place])   . Post-menopausal   . Status post chemotherapy    left breast cancer  . Status post radiation therapy    breast cancer    PAST SURGICAL HISTORY: Past  Surgical History:  Procedure Laterality Date  . BREAST BIOPSY Left    positive 2015    FAMILY HISTORY Family History  Problem Relation Age of Onset  . Breast cancer Neg Hx     GYNECOLOGIC HISTORY:  No LMP recorded. Patient is postmenopausal.     ADVANCED DIRECTIVES:    HEALTH MAINTENANCE: Social History  Substance Use Topics  . Smoking status: Never Smoker  . Smokeless tobacco: Not on file  . Alcohol use No     Colonoscopy:  PAP:  Bone density:  Lipid panel:  No Known Allergies  Current Outpatient Prescriptions  Medication Sig Dispense Refill  . acetaminophen (TYLENOL) 325 MG tablet Take by mouth.    Marland Kitchen alendronate (FOSAMAX) 70 MG tablet TAKE ONE TABLET EVERY WEEK. TAKE WITH A FULL GLASS OF WATER ON AN EMPTY STOMACH. 12 tablet 0  . amLODipine (NORVASC) 5 MG tablet     . apixaban (ELIQUIS) 5 MG TABS tablet Take 5 mg by mouth 2 (two) times daily.     Marland Kitchen azithromycin (ZITHROMAX Z-PAK) 250 MG tablet 1 Z-pack, follow package directions 6 each 0  . baclofen (LIORESAL) 10 MG tablet Take 10 mg by mouth.     . Calcium Carbonate-Vitamin D 600-400 MG-UNIT per tablet Take 1 tablet by mouth 2 (two) times daily.     Marland Kitchen FLUoxetine (PROZAC) 20 MG capsule Take 20 mg by mouth daily.     Marland Kitchen letrozole (FEMARA) 2.5 MG tablet TAKE ONE (1) TABLET EACH DAY 30 tablet 11  . omeprazole (PRILOSEC) 20 MG capsule Take 20 mg by mouth daily.     Marland Kitchen  oxybutynin (DITROPAN) 5 MG tablet Take 5 mg by mouth 2 (two) times daily.     Marland Kitchen oxyCODONE (OXY IR/ROXICODONE) 5 MG immediate release tablet Take 5 mg by mouth every 6 (six) hours as needed.     Marland Kitchen tiZANidine (ZANAFLEX) 4 MG tablet Take 4 mg by mouth every 6 (six) hours as needed.     Marland Kitchen amantadine (SYMMETREL) 100 MG capsule Take 100 mg by mouth daily.      No current facility-administered medications for this visit.    Facility-Administered Medications Ordered in Other Visits  Medication Dose Route Frequency Provider Last Rate Last Dose  . sodium chloride  0.9 % injection 10 mL  10 mL Intravenous PRN Choksi, Delorise Shiner, MD   10 mL at 06/23/14 1106    OBJECTIVE: BP 120/77 (Patient Position: Sitting)   Pulse 77   Temp 97.6 F (36.4 C) (Tympanic)   Resp 18   Wt 120 lb 6 oz (54.6 kg)   BMI 22.02 kg/m    Body mass index is 22.02 kg/m.    ECOG FS:2 - Symptomatic, <50% confined to bed  General: Well-developed, well-nourished, no acute distress. Patient in wheelchair today due to weakness from [place]. Eyes: Pink conjunctiva, anicteric sclera. Lungs: Clear to auscultation bilaterally. Heart: Regular rate and rhythm. No rubs, murmurs, or gallops. Abdomen: Soft, nontender, nondistended. No organomegaly noted, normoactive bowel sounds. Breast: Patient requested exam be deferred today.  Musculoskeletal: No edema, cyanosis, or clubbing. Neuro: Alert, answering all questions appropriately. Cranial nerves grossly intact. Bilateral lower extremity weakness.  Skin: No rashes or petechiae noted. Psych: Normal affect.    LAB RESULTS:  No visits with results within 3 Day(s) from this visit.  Latest known visit with results is:  Appointment on 05/26/2016  Component Date Value Ref Range Status  . CA 27.29 05/26/2016 27.4  0.0 - 38.6 U/mL Final   Comment: (NOTE) Bayer Centaur/ACS methodology Performed At: [facility] [place], [place] [id number] Lindon Romp MD FT:[id number]     STUDIES: No results found.  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 She completed her chemotherapy in approximately March 2016. Treatment was discontinued early secondary to worsening symptoms of her multiple sclerosis. Patient was initially on tamoxifen, but recently switched to letrozole since  she had progressed into menopause. Continue treatment for total 5 years completing in April 2021. Her most recent mammogram on September 22, 2016 was reported as BI-RADS 2. Repeat in August 2019. Return to clinic in 6 months for routine evaluation. 2. Multiple sclerosis: Patient is currently receiving 500 mg of Rituxan every 4 months. Continue close follow-up with [employer] neurology. 3. Osteoporosis: Patient's most recent bone marrow density on Jun 25, 2016 reported a T score of -3.2. This is slightly worse in 1 year prior. Continue Fosamax, calcium, and vitamin D. Repeat bone mineral density in May 2019. 4. Port: Patient has been inquired about removing her port, but ultimately decided to keep it in to continue using it for her Rituxan infusions. Continue port flush every 6 weeks.  Patient expressed understanding and was in agreement with this plan. She also understands that She can call clinic at any time with any questions, concerns, or complaints.    Breast CA ([place])  Staging form: Breast, AJCC 7th Edition     Clinical stage from 09/09/2014: Stage IIA (T1c, N1, M0) - Signed by [name], MD on 09/09/2014   [name], MD   11/27/2016 2:04 PM

## 2016-11-26 ENCOUNTER — Other Ambulatory Visit: Payer: Self-pay | Admitting: *Deleted

## 2016-11-26 DIAGNOSIS — C50919 Malignant neoplasm of unspecified site of unspecified female breast: Secondary | ICD-10-CM

## 2016-11-27 ENCOUNTER — Inpatient Hospital Stay (HOSPITAL_BASED_OUTPATIENT_CLINIC_OR_DEPARTMENT_OTHER): Payer: BC Managed Care – PPO | Admitting: Oncology

## 2016-11-27 ENCOUNTER — Inpatient Hospital Stay: Payer: BC Managed Care – PPO | Attending: Oncology

## 2016-11-27 VITALS — BP 120/77 | HR 77 | Temp 97.6°F | Resp 18 | Wt 120.4 lb

## 2016-11-27 DIAGNOSIS — C50412 Malignant neoplasm of upper-outer quadrant of left female breast: Secondary | ICD-10-CM | POA: Insufficient documentation

## 2016-11-27 DIAGNOSIS — Z95828 Presence of other vascular implants and grafts: Secondary | ICD-10-CM

## 2016-11-27 DIAGNOSIS — I10 Essential (primary) hypertension: Secondary | ICD-10-CM | POA: Diagnosis not present

## 2016-11-27 DIAGNOSIS — C50919 Malignant neoplasm of unspecified site of unspecified female breast: Secondary | ICD-10-CM

## 2016-11-27 DIAGNOSIS — Z9221 Personal history of antineoplastic chemotherapy: Secondary | ICD-10-CM | POA: Diagnosis not present

## 2016-11-27 DIAGNOSIS — Z170 Estrogen receptor positive status [ER+]: Secondary | ICD-10-CM | POA: Diagnosis not present

## 2016-11-27 DIAGNOSIS — Z79899 Other long term (current) drug therapy: Secondary | ICD-10-CM | POA: Diagnosis not present

## 2016-11-27 DIAGNOSIS — G35 Multiple sclerosis: Secondary | ICD-10-CM | POA: Diagnosis not present

## 2016-11-27 DIAGNOSIS — M810 Age-related osteoporosis without current pathological fracture: Secondary | ICD-10-CM

## 2016-11-27 DIAGNOSIS — Z923 Personal history of irradiation: Secondary | ICD-10-CM

## 2016-11-27 DIAGNOSIS — Z79811 Long term (current) use of aromatase inhibitors: Secondary | ICD-10-CM | POA: Insufficient documentation

## 2016-11-27 MED ORDER — SODIUM CHLORIDE 0.9% FLUSH
10.0000 mL | INTRAVENOUS | Status: AC | PRN
  Administered 2016-11-27: 10 mL
  Filled 2016-11-27: qty 10

## 2016-11-27 MED ORDER — HEPARIN SOD (PORK) LOCK FLUSH 100 UNIT/ML IV SOLN
500.0000 [IU] | INTRAVENOUS | Status: AC | PRN
  Administered 2016-11-27: 500 [IU]

## 2016-11-27 NOTE — Progress Notes (Signed)
Pt in today for 6 month follow up for breast cancer.  Denies any difficulties or concerns today.

## 2016-11-28 LAB — CANCER ANTIGEN 27.29: CA 27.29: 24.5 U/mL (ref 0.0–38.6)

## 2017-02-27 ENCOUNTER — Inpatient Hospital Stay: Payer: BC Managed Care – PPO | Attending: Oncology

## 2017-02-27 DIAGNOSIS — C50412 Malignant neoplasm of upper-outer quadrant of left female breast: Secondary | ICD-10-CM | POA: Diagnosis not present

## 2017-02-27 DIAGNOSIS — Z452 Encounter for adjustment and management of vascular access device: Secondary | ICD-10-CM | POA: Diagnosis present

## 2017-02-27 DIAGNOSIS — Z95828 Presence of other vascular implants and grafts: Secondary | ICD-10-CM

## 2017-02-27 MED ORDER — HEPARIN SOD (PORK) LOCK FLUSH 100 UNIT/ML IV SOLN
500.0000 [IU] | INTRAVENOUS | Status: AC | PRN
  Administered 2017-02-27: 500 [IU]

## 2017-02-27 MED ORDER — SODIUM CHLORIDE 0.9% FLUSH
10.0000 mL | INTRAVENOUS | Status: AC | PRN
  Administered 2017-02-27: 10 mL
  Filled 2017-02-27: qty 10

## 2017-06-03 NOTE — Progress Notes (Signed)
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  INTERVAL HISTORY: Patient returns to clinic today for routine six-month follow-up. She continues to tolerate letrozole and Fosamax without significant side effects.  She continues to have chronic weakness and fatigue secondary to her multiple sclerosis.  She denies any new neurologic complaints.  She denies any recent fevers or illnesses.  She has no chest pain or shortness of breath.  She has a good appetite and denies weight loss. She denies any nausea, vomiting, constipation, or diarrhea. She has no urinary complaints.  Patient offers no specific complaints today.   REVIEW OF SYSTEMS:   Review of Systems  Constitutional: Positive for malaise/fatigue. Negative for fever and weight loss.  Respiratory: Negative.  Negative for cough and shortness of breath.   Cardiovascular: Negative.  Negative for chest pain and leg swelling.  Gastrointestinal: Negative.  Negative for abdominal pain and constipation.  Genitourinary: Negative.  Negative for dysuria.  Musculoskeletal: Negative.  Negative for back pain.  Skin: Negative.  Negative for rash.  Neurological: Positive for tremors, sensory change, focal weakness and weakness.  Psychiatric/Behavioral: Negative.  Negative for depression. The patient is not nervous/anxious.     As per HPI. Otherwise, a complete review of systems is negative.  PAST MEDICAL HISTORY: Past Medical History:  Diagnosis Date  . Breast cancer ([place]) 2015   left breast cancer  . Cancer [facility]) left    breast cancer  . Hypertension   . MS (multiple sclerosis) ([place])   . Post-menopausal   . Status post chemotherapy    left  breast cancer  . Status post radiation therapy    breast cancer    PAST SURGICAL HISTORY: Past Surgical History:  Procedure Laterality Date  . BREAST BIOPSY Left    positive 2015    FAMILY HISTORY Family History  Problem Relation Age of Onset  . Breast cancer Neg Hx     GYNECOLOGIC HISTORY:  No LMP recorded. Patient is postmenopausal.     ADVANCED DIRECTIVES:    HEALTH MAINTENANCE: Social History   Tobacco Use  . Smoking status: Never Smoker  Substance Use Topics  . Alcohol use: No  . Drug use: No     Colonoscopy:  PAP:  Bone density:  Lipid panel:  No Known Allergies  Current Outpatient Medications  Medication Sig Dispense Refill  . alendronate (FOSAMAX) 70 MG tablet TAKE ONE TABLET EVERY WEEK. TAKE WITH A FULL GLASS OF WATER ON AN EMPTY STOMACH. 12 tablet 0  . amantadine (SYMMETREL) 100 MG capsule Take 100 mg by mouth daily.     Marland Kitchen amLODipine (NORVASC) 5 MG tablet     . apixaban (ELIQUIS) 5 MG TABS tablet Take 5 mg by mouth 2 (two) times daily.     . baclofen (LIORESAL) 10 MG tablet Take 10 mg by mouth.     . Calcium Carbonate-Vitamin D 600-400 MG-UNIT per tablet Take 1 tablet by mouth 2 (two) times daily.     Marland Kitchen FLUoxetine (PROZAC) 20 MG capsule Take 20 mg by mouth daily.     Marland Kitchen letrozole (FEMARA) 2.5 MG tablet TAKE ONE (1) TABLET EACH DAY 30 tablet 11  . omeprazole (PRILOSEC) 20 MG capsule Take 20 mg  by mouth daily.     Marland Kitchen oxybutynin (DITROPAN) 5 MG tablet Take 5 mg by mouth 2 (two) times daily.     Marland Kitchen oxyCODONE (OXY IR/ROXICODONE) 5 MG immediate release tablet Take 5 mg by mouth every 6 (six) hours as needed.     Marland Kitchen tiZANidine (ZANAFLEX) 4 MG tablet Take 4 mg by mouth every 6 (six) hours as needed.     Marland Kitchen acetaminophen (TYLENOL) 325 MG tablet Take by mouth.    . clonazePAM (KLONOPIN) 0.5 MG tablet      No current facility-administered medications for this visit.    Facility-Administered Medications Ordered in Other Visits  Medication Dose Route Frequency  Provider Last Rate Last Dose  . sodium chloride 0.9 % injection 10 mL  10 mL Intravenous PRN Choksi, Delorise Shiner, MD   10 mL at 06/23/14 1106    OBJECTIVE: BP 122/77 (BP Location: Left Arm, Patient Position: Sitting)   Pulse 85   Temp (!) 96.7 F (35.9 C) (Tympanic)   Resp 18   Wt 110 lb 14.4 oz (50.3 kg)   BMI 20.28 kg/m    Body mass index is 20.28 kg/m.    ECOG FS:2 - Symptomatic, <50% confined to bed  General: Well-developed, well-nourished, no acute distress.  In a wheelchair secondary to weakness from [place]. Eyes: Pink conjunctiva, anicteric sclera. Breast: Patient requested exam be deferred today. Lungs: Clear to auscultation bilaterally. Heart: Regular rate and rhythm. No rubs, murmurs, or gallops. Abdomen: Soft, nontender, nondistended. No organomegaly noted, normoactive bowel sounds. Musculoskeletal: No edema, cyanosis, or clubbing.  Lower extremity weakness secondary to MS. Neuro: Alert, answering all questions appropriately. Cranial nerves grossly intact. Skin: No rashes or petechiae noted. Psych: Normal affect.   LAB RESULTS:  No visits with results within 3 Day(s) from this visit.  Latest known visit with results is:  Infusion on 11/27/2016  Component Date Value Ref Range Status  . CA 27.29 11/27/2016 24.5  0.0 - 38.6 U/mL Final   Comment: (NOTE) Bayer Centaur/ACS methodology Performed At: [facility] [place], [place] [id number] Lindon Romp MD GQ:[id number]     STUDIES: No results found.  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 She completed her chemotherapy in approximately March 2016. Treatment was discontinued early secondary to worsening symptoms of her multiple sclerosis.   Patient was initially on tamoxifen, but now is currently on letrozole.  Continue treatment for total 5 years completing in April 2021. Her most recent mammogram on September 22, 2016 was reported as BI-RADS 2.  Repeat mammogram in August 2019.  Return to clinic in 6 months for routine evaluation. 2. Multiple sclerosis: Continue 500 mg of Rituxan every 4 months as indicated. Continue close follow-up with [place] neurology. 3. Osteoporosis: Patient's most recent bone marrow density on Jun 25, 2016 reported a T score of -3.2. This is slightly worse in 1 year prior. Continue Fosamax, calcium, and vitamin D. Repeat bone mineral density in May 2019. 4. Port:  Continue port flush every 6 weeks.  Approximately 20 minutes was spent in discussion of which greater than 50% was consultation.  Patient expressed understanding and was in agreement with this plan. She also understands that She can call clinic at any time with any questions,  concerns, or complaints.    Breast CA [facility])   Staging form: Breast, AJCC 7th Edition     Clinical stage from 09/09/2014: Stage IIA (T1c, N1, M0) - Signed by [name], MD on 09/09/2014   [name], MD   06/04/2017 10:27 AM

## 2017-06-04 ENCOUNTER — Other Ambulatory Visit: Payer: Self-pay

## 2017-06-04 ENCOUNTER — Encounter: Payer: Self-pay | Admitting: Oncology

## 2017-06-04 ENCOUNTER — Other Ambulatory Visit: Payer: BC Managed Care – PPO

## 2017-06-04 ENCOUNTER — Inpatient Hospital Stay: Payer: BC Managed Care – PPO | Attending: Oncology

## 2017-06-04 ENCOUNTER — Ambulatory Visit: Payer: BC Managed Care – PPO | Admitting: Oncology

## 2017-06-04 ENCOUNTER — Inpatient Hospital Stay (HOSPITAL_BASED_OUTPATIENT_CLINIC_OR_DEPARTMENT_OTHER): Payer: BC Managed Care – PPO | Admitting: Oncology

## 2017-06-04 VITALS — BP 122/77 | HR 85 | Temp 96.7°F | Resp 18 | Wt 110.9 lb

## 2017-06-04 DIAGNOSIS — Z7901 Long term (current) use of anticoagulants: Secondary | ICD-10-CM

## 2017-06-04 DIAGNOSIS — G35 Multiple sclerosis: Secondary | ICD-10-CM | POA: Insufficient documentation

## 2017-06-04 DIAGNOSIS — Z79811 Long term (current) use of aromatase inhibitors: Secondary | ICD-10-CM | POA: Insufficient documentation

## 2017-06-04 DIAGNOSIS — Z79899 Other long term (current) drug therapy: Secondary | ICD-10-CM | POA: Insufficient documentation

## 2017-06-04 DIAGNOSIS — Z9221 Personal history of antineoplastic chemotherapy: Secondary | ICD-10-CM

## 2017-06-04 DIAGNOSIS — Z170 Estrogen receptor positive status [ER+]: Secondary | ICD-10-CM

## 2017-06-04 DIAGNOSIS — R5383 Other fatigue: Secondary | ICD-10-CM | POA: Diagnosis not present

## 2017-06-04 DIAGNOSIS — M810 Age-related osteoporosis without current pathological fracture: Secondary | ICD-10-CM | POA: Insufficient documentation

## 2017-06-04 DIAGNOSIS — C50412 Malignant neoplasm of upper-outer quadrant of left female breast: Secondary | ICD-10-CM

## 2017-06-04 NOTE — Progress Notes (Signed)
Here for follow  Per pt " ive got no c/o and Im feeling fine "

## 2017-06-05 LAB — CANCER ANTIGEN 27.29: CA 27.29: 22.6 U/mL (ref 0.0–38.6)

## 2017-06-17 ENCOUNTER — Other Ambulatory Visit: Payer: Self-pay | Admitting: Oncology

## 2017-07-01 ENCOUNTER — Ambulatory Visit
Admission: RE | Admit: 2017-07-01 | Discharge: 2017-07-01 | Disposition: A | Discharge status: Home / Self Care | Payer: BC Managed Care – PPO | Source: Ambulatory Visit | Attending: Oncology | Admitting: Oncology

## 2017-07-01 DIAGNOSIS — C50412 Malignant neoplasm of upper-outer quadrant of left female breast: Secondary | ICD-10-CM | POA: Insufficient documentation

## 2017-07-01 HISTORY — DX: Personal history of antineoplastic chemotherapy: Z92.21

## 2017-07-01 HISTORY — DX: Personal history of irradiation: Z92.3

## 2017-09-28 ENCOUNTER — Ambulatory Visit
Admission: RE | Admit: 2017-09-28 | Discharge: 2017-09-28 | Disposition: A | Discharge status: Home / Self Care | Payer: BC Managed Care – PPO | Source: Ambulatory Visit | Attending: Oncology | Admitting: Oncology

## 2017-09-28 DIAGNOSIS — C50412 Malignant neoplasm of upper-outer quadrant of left female breast: Secondary | ICD-10-CM | POA: Diagnosis not present

## 2017-10-21 ENCOUNTER — Inpatient Hospital Stay: Payer: BC Managed Care – PPO | Attending: Oncology

## 2017-10-21 DIAGNOSIS — C50412 Malignant neoplasm of upper-outer quadrant of left female breast: Secondary | ICD-10-CM | POA: Insufficient documentation

## 2017-10-21 DIAGNOSIS — Z452 Encounter for adjustment and management of vascular access device: Secondary | ICD-10-CM | POA: Diagnosis not present

## 2017-10-21 DIAGNOSIS — Z95828 Presence of other vascular implants and grafts: Secondary | ICD-10-CM

## 2017-10-21 MED ORDER — HEPARIN SOD (PORK) LOCK FLUSH 100 UNIT/ML IV SOLN
500.0000 [IU] | Freq: Once | INTRAVENOUS | Status: AC
  Administered 2017-10-21: 500 [IU] via INTRAVENOUS

## 2017-10-21 MED ORDER — SODIUM CHLORIDE 0.9% FLUSH
10.0000 mL | Freq: Once | INTRAVENOUS | Status: AC
  Administered 2017-10-21: 10 mL via INTRAVENOUS
  Filled 2017-10-21: qty 10

## 2017-11-29 NOTE — Progress Notes (Deleted)
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  INTERVAL HISTORY: Patient returns to clinic today for routine six-month follow-up. She continues to tolerate letrozole and Fosamax without significant side effects.  She continues to have chronic weakness and fatigue secondary to her multiple sclerosis.  She denies any new neurologic complaints.  She denies any recent fevers or illnesses.  She has no chest pain or shortness of breath.  She has a good appetite and denies weight loss. She denies any nausea, vomiting, constipation, or diarrhea. She has no urinary complaints.  Patient offers no specific complaints today.   REVIEW OF SYSTEMS:   Review of Systems  Constitutional: Positive for malaise/fatigue. Negative for fever and weight loss.  Respiratory: Negative.  Negative for cough and shortness of breath.   Cardiovascular: Negative.  Negative for chest pain and leg swelling.  Gastrointestinal: Negative.  Negative for abdominal pain and constipation.  Genitourinary: Negative.  Negative for dysuria.  Musculoskeletal: Negative.  Negative for back pain.  Skin: Negative.  Negative for rash.  Neurological: Positive for tremors, sensory change, focal weakness and weakness.  Psychiatric/Behavioral: Negative.  Negative for depression. The patient is not nervous/anxious.     As per HPI. Otherwise, a complete review of systems is negative.  PAST MEDICAL HISTORY: Past Medical History:  Diagnosis Date  . Breast cancer ([place]) 2015   left breast cancer  . Hypertension   . MS (multiple sclerosis) ([place])   . Personal history of chemotherapy   . Personal history of radiation therapy   . Post-menopausal    . Status post chemotherapy    left breast cancer  . Status post radiation therapy    breast cancer    PAST SURGICAL HISTORY: Past Surgical History:  Procedure Laterality Date  . BREAST BIOPSY Left    positive 2015  . BREAST LUMPECTOMY Left 2015   invasive, f/u radiation.     FAMILY HISTORY Family History  Problem Relation Age of Onset  . Breast cancer Neg Hx     GYNECOLOGIC HISTORY:  No LMP recorded. Patient is postmenopausal.     ADVANCED DIRECTIVES:    HEALTH MAINTENANCE: Social History   Tobacco Use  . Smoking status: Never Smoker  Substance Use Topics  . Alcohol use: No  . Drug use: No     Colonoscopy:  PAP:  Bone density:  Lipid panel:  No Known Allergies  Current Outpatient Medications  Medication Sig Dispense Refill  . acetaminophen (TYLENOL) 325 MG tablet Take by mouth.    Marland Kitchen alendronate (FOSAMAX) 70 MG tablet TAKE ONE TABLET EVERY WEEK. TAKE WITH A FULL GLASS OF WATER ON AN EMPTY STOMACH. 12 tablet 0  . amantadine (SYMMETREL) 100 MG capsule Take 100 mg by mouth daily.     Marland Kitchen amLODipine (NORVASC) 5 MG tablet     . apixaban (ELIQUIS) 5 MG TABS tablet Take 5 mg by mouth 2 (two) times daily.     . baclofen (LIORESAL) 10 MG tablet Take 10 mg by mouth.     . Calcium Carbonate-Vitamin D 600-400 MG-UNIT per tablet Take 1 tablet by mouth 2 (two) times daily.     . clonazePAM (KLONOPIN) 0.5 MG tablet     . FLUoxetine (  PROZAC) 20 MG capsule Take 20 mg by mouth daily.     Marland Kitchen letrozole (FEMARA) 2.5 MG tablet TAKE ONE (1) TABLET EACH DAY 30 tablet 10  . omeprazole (PRILOSEC) 20 MG capsule Take 20 mg by mouth daily.     Marland Kitchen oxybutynin (DITROPAN) 5 MG tablet Take 5 mg by mouth 2 (two) times daily.     Marland Kitchen oxyCODONE (OXY IR/ROXICODONE) 5 MG immediate release tablet Take 5 mg by mouth every 6 (six) hours as needed.     Marland Kitchen tiZANidine (ZANAFLEX) 4 MG tablet Take 4 mg by mouth every 6 (six) hours as needed.      No current facility-administered medications for this visit.      Facility-Administered Medications Ordered in Other Visits  Medication Dose Route Frequency Provider Last Rate Last Dose  . sodium chloride 0.9 % injection 10 mL  10 mL Intravenous PRN Forest Gleason, MD   10 mL at 06/23/14 1106    OBJECTIVE: There were no vitals taken for this visit.   There is no height or weight on file to calculate BMI.    ECOG FS:2 - Symptomatic, <50% confined to bed  General: Well-developed, well-nourished, no acute distress.  In a wheelchair secondary to weakness from [place]. Eyes: Pink conjunctiva, anicteric sclera. Breast: Patient requested exam be deferred today. Lungs: Clear to auscultation bilaterally. Heart: Regular rate and rhythm. No rubs, murmurs, or gallops. Abdomen: Soft, nontender, nondistended. No organomegaly noted, normoactive bowel sounds. Musculoskeletal: No edema, cyanosis, or clubbing.  Lower extremity weakness secondary to MS. Neuro: Alert, answering all questions appropriately. Cranial nerves grossly intact. Skin: No rashes or petechiae noted. Psych: Normal affect.   LAB RESULTS:  No visits with results within 3 Day(s) from this visit.  Latest known visit with results is:  Appointment on 06/04/2017  Component Date Value Ref Range Status  . CA 27.29 06/04/2017 22.6  0.0 - 38.6 U/mL Final   Comment: (NOTE) Siemens Centaur Immunochemiluminometric Methodology [facility]) Values obtained with different assay methods or kits cannot be used interchangeably. Results cannot be interpreted as absolute evidence of the presence or absence of malignant disease. Performed At: [facility] [place], [place] [id number] Rush Farmer MD [id number] Performed at [facility], [address] [place], [place] [address]     STUDIES: No results found.  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 ER/PR positive, HER-2 negative stage IIa  adenocarcinoma of the upper outer quadrant of the left breast: Patient is status post lumpectomy, adjuvant chemotherapy, and XRT. She completed her chemotherapy in approximately March 2016. Treatment was discontinued early secondary to worsening symptoms of her multiple sclerosis.  Patient was initially on tamoxifen, but now is currently on letrozole.  Continue treatment for total 5 years completing in April 2021. Her most recent mammogram on September 22, 2016 was reported as BI-RADS 2.  Repeat mammogram in August 2019.  Return to clinic in 6 months for routine evaluation. 2. Multiple sclerosis: Continue 500 mg of Rituxan every 4 months as indicated. Continue close follow-up with [place] neurology. 3. Osteoporosis: Patient's most recent bone marrow density on Jun 25, 2016 reported a T score of -3.2. This is slightly worse in 1 year prior. Continue Fosamax, calcium, and vitamin D. Repeat bone mineral density in May 2019. 4. Port:  Continue port flush every 6 weeks.  Approximately 20 minutes was spent in discussion of which  greater than 50% was consultation.  Patient expressed understanding and was in agreement with this plan. She also understands that She can call clinic at any time with any questions, concerns, or complaints.    Breast CA [facility])   Staging form: Breast, AJCC 7th Edition     Clinical stage from 09/09/2014: Stage IIA (T1c, N1, M0) - Signed by [name], MD on 09/09/2014   [name], MD   11/29/2017 9:51 AM

## 2017-12-02 ENCOUNTER — Other Ambulatory Visit: Payer: Self-pay | Admitting: *Deleted

## 2017-12-02 DIAGNOSIS — C50919 Malignant neoplasm of unspecified site of unspecified female breast: Secondary | ICD-10-CM

## 2017-12-03 ENCOUNTER — Inpatient Hospital Stay: Payer: BC Managed Care – PPO

## 2017-12-03 ENCOUNTER — Inpatient Hospital Stay: Payer: BC Managed Care – PPO | Admitting: Oncology

## 2017-12-09 ENCOUNTER — Other Ambulatory Visit: Payer: BC Managed Care – PPO

## 2017-12-09 ENCOUNTER — Ambulatory Visit: Payer: BC Managed Care – PPO | Admitting: Oncology

## 2017-12-13 NOTE — Progress Notes (Signed)
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  INTERVAL HISTORY: Patient returns to clinic today for routine six-month follow-up.  She continues to tolerate letrozole and Fosamax well without significant side effects. She continues to have chronic weakness and fatigue secondary to her multiple sclerosis.  She denies any new neurologic complaints.  She denies any recent fevers or illnesses.  She has no chest pain or shortness of breath.  She has a good appetite and denies weight loss. She denies any nausea, vomiting, constipation, or diarrhea. She has no urinary complaints.  Patient offers no further specific complaints today.  REVIEW OF SYSTEMS:   Review of Systems  Constitutional: Positive for malaise/fatigue. Negative for fever and weight loss.  Respiratory: Negative.  Negative for cough and shortness of breath.   Cardiovascular: Negative.  Negative for chest pain and leg swelling.  Gastrointestinal: Negative.  Negative for abdominal pain and constipation.  Genitourinary: Negative.  Negative for dysuria.  Musculoskeletal: Negative.  Negative for back pain.  Skin: Negative.  Negative for rash.  Neurological: Positive for tremors, sensory change, focal weakness and weakness.  Psychiatric/Behavioral: Negative.  Negative for depression. The patient is not nervous/anxious.     As per HPI. Otherwise, a complete review of systems is negative.  PAST MEDICAL HISTORY: Past Medical History:  Diagnosis Date  . Breast cancer ([place]) 2015   left breast cancer  . Hypertension   . MS (multiple sclerosis) ([place])   . Personal history of chemotherapy   . Personal history of radiation therapy   .  Post-menopausal   . Status post chemotherapy    left breast cancer  . Status post radiation therapy    breast cancer    PAST SURGICAL HISTORY: Past Surgical History:  Procedure Laterality Date  . BREAST BIOPSY Left    positive 2015  . BREAST LUMPECTOMY Left 2015   invasive, f/u radiation.     FAMILY HISTORY Family History  Problem Relation Age of Onset  . Breast cancer Neg Hx     GYNECOLOGIC HISTORY:  No LMP recorded. Patient is postmenopausal.     ADVANCED DIRECTIVES:    HEALTH MAINTENANCE: Social History   Tobacco Use  . Smoking status: Never Smoker  Substance Use Topics  . Alcohol use: No  . Drug use: No     Colonoscopy:  PAP:  Bone density:  Lipid panel:  No Known Allergies  Current Outpatient Medications  Medication Sig Dispense Refill  . acetaminophen (TYLENOL) 325 MG tablet Take by mouth.    Marland Kitchen alendronate (FOSAMAX) 70 MG tablet TAKE ONE TABLET EVERY WEEK. TAKE WITH A FULL GLASS OF WATER ON AN EMPTY STOMACH. 12 tablet 0  . amantadine (SYMMETREL) 100 MG capsule Take 100 mg by mouth daily.     Marland Kitchen amLODipine (NORVASC) 5 MG tablet     . aspirin EC 81 MG tablet Take 81 mg by mouth daily.    . baclofen (LIORESAL) 10 MG tablet Take 10 mg by mouth.     . Calcium Carbonate-Vitamin D 600-400 MG-UNIT per tablet Take 1 tablet by mouth 2 (two) times daily.     . clonazePAM (KLONOPIN) 0.5 MG tablet     . FLUoxetine (PROZAC) 20 MG capsule  Take 20 mg by mouth daily.     Marland Kitchen omeprazole (PRILOSEC) 20 MG capsule Take 20 mg by mouth daily.     Marland Kitchen oxybutynin (DITROPAN) 5 MG tablet Take 5 mg by mouth 2 (two) times daily.     Marland Kitchen tiZANidine (ZANAFLEX) 4 MG tablet Take 4 mg by mouth every 6 (six) hours as needed.     Marland Kitchen apixaban (ELIQUIS) 5 MG TABS tablet Take 5 mg by mouth 2 (two) times daily.     Marland Kitchen oxyCODONE (OXY IR/ROXICODONE) 5 MG immediate release tablet Take 5 mg by mouth every 6 (six) hours as needed.     . tamoxifen (NOLVADEX) 20 MG tablet Take 1 tablet (20 mg total) by  mouth daily. 30 tablet 6   No current facility-administered medications for this visit.    Facility-Administered Medications Ordered in Other Visits  Medication Dose Route Frequency Provider Last Rate Last Dose  . sodium chloride 0.9 % injection 10 mL  10 mL Intravenous PRN Choksi, Delorise Shiner, MD   10 mL at 06/23/14 1106    OBJECTIVE: BP 134/75 (BP Location: Right Arm, Patient Position: Sitting)   Pulse 86   Temp (!) 96.6 F (35.9 C)   Resp 18   Wt 121 lb 8 oz (55.1 kg)   BMI 22.22 kg/m    Body mass index is 22.22 kg/m.    ECOG FS:2 - Symptomatic, <50% confined to bed  General: Well-developed, well-nourished, no acute distress.  In wheelchair. Eyes: Pink conjunctiva, anicteric sclera. HEENT: Normocephalic, moist mucous membranes. Breast: Exam deferred today. Lungs: Clear to auscultation bilaterally. Heart: Regular rate and rhythm. No rubs, murmurs, or gallops. Abdomen: Soft, nontender, nondistended. No organomegaly noted, normoactive bowel sounds. Musculoskeletal: No edema, cyanosis, or clubbing. Neuro: Alert, answering all questions appropriately. Cranial nerves grossly intact. Skin: No rashes or petechiae noted. Psych: Normal affect.  LAB RESULTS:  No visits with results within 3 Day(s) from this visit.  Latest known visit with results is:  Appointment on 06/04/2017  Component Date Value Ref Range Status  . CA 27.29 06/04/2017 22.6  0.0 - 38.6 U/mL Final   Comment: (NOTE) Siemens Centaur Immunochemiluminometric Methodology [facility]) Values obtained with different assay methods or kits cannot be used interchangeably. Results cannot be interpreted as absolute evidence of the presence or absence of malignant disease. Performed At: [facility] [place], [place] [id number] Rush Farmer MD GQ:[id number] Performed at [facility], [address] [place], [place] [address]     STUDIES: No results found.  ASSESSMENT:  ER/PR positive, HER-2  negative stage IIa adenocarcinoma of the upper outer quadrant of the left breast  PLAN:    1. 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 She completed her chemotherapy in approximately March 2016. Treatment was discontinued early secondary to worsening symptoms of her multiple sclerosis.  Patient was initially on tamoxifen, but now is currently on letrozole.  Because of patient's worsening osteoporosis, will transition patient back to tamoxifen.  She will complete 5 years of treatment in April 2021.  Her most recent mammogram on September 28, 2017 was reported as BI-RADS 2.  Repeat in August 2020.  Return to clinic in 6 months for routine evaluation.  2. Multiple sclerosis: Chronic and unchanged.  Continue 500 mg of Rituxan every 4 months as indicated. Continue close follow-up with [place] [facility]. 3. Osteoporosis: Patient's most recent bone mineral density  on Jul 01, 2017 reported T score of -3.5.  This is slightly worse than 1 year prior when the reported T score was -3.2.  Continue Fosamax, calcium, vitamin D.  Patient was given a prescription for tamoxifen as above.  Repeat bone mineral density in May 2020.  4. Port:  Continue port flush every 6 weeks.  Patient expressed understanding and was in agreement with this plan. She also understands that She can call clinic at any time with any questions, concerns, or complaints.    Breast CA [facility])   Staging form: Breast, AJCC 7th Edition     Clinical stage from 09/09/2014: Stage IIA (T1c, N1, M0) - Signed by [name], MD on 09/09/2014   [name], MD   12/17/2017 2:46 PM

## 2017-12-17 ENCOUNTER — Encounter: Payer: Self-pay | Admitting: Oncology

## 2017-12-17 ENCOUNTER — Other Ambulatory Visit: Payer: Self-pay

## 2017-12-17 ENCOUNTER — Inpatient Hospital Stay: Payer: BC Managed Care – PPO | Attending: Oncology

## 2017-12-17 ENCOUNTER — Inpatient Hospital Stay (HOSPITAL_BASED_OUTPATIENT_CLINIC_OR_DEPARTMENT_OTHER): Payer: BC Managed Care – PPO | Admitting: Oncology

## 2017-12-17 VITALS — BP 134/75 | HR 86 | Temp 96.6°F | Resp 18 | Wt 121.5 lb

## 2017-12-17 DIAGNOSIS — G35 Multiple sclerosis: Secondary | ICD-10-CM | POA: Insufficient documentation

## 2017-12-17 DIAGNOSIS — C50412 Malignant neoplasm of upper-outer quadrant of left female breast: Secondary | ICD-10-CM | POA: Insufficient documentation

## 2017-12-17 DIAGNOSIS — I10 Essential (primary) hypertension: Secondary | ICD-10-CM | POA: Diagnosis not present

## 2017-12-17 DIAGNOSIS — Z170 Estrogen receptor positive status [ER+]: Secondary | ICD-10-CM | POA: Diagnosis not present

## 2017-12-17 DIAGNOSIS — Z79899 Other long term (current) drug therapy: Secondary | ICD-10-CM | POA: Diagnosis not present

## 2017-12-17 DIAGNOSIS — Z9221 Personal history of antineoplastic chemotherapy: Secondary | ICD-10-CM

## 2017-12-17 DIAGNOSIS — Z7901 Long term (current) use of anticoagulants: Secondary | ICD-10-CM | POA: Diagnosis not present

## 2017-12-17 DIAGNOSIS — Z79810 Long term (current) use of selective estrogen receptor modulators (SERMs): Secondary | ICD-10-CM

## 2017-12-17 DIAGNOSIS — Z7982 Long term (current) use of aspirin: Secondary | ICD-10-CM | POA: Diagnosis not present

## 2017-12-17 DIAGNOSIS — Z923 Personal history of irradiation: Secondary | ICD-10-CM

## 2017-12-17 DIAGNOSIS — C50919 Malignant neoplasm of unspecified site of unspecified female breast: Secondary | ICD-10-CM

## 2017-12-17 MED ORDER — TAMOXIFEN CITRATE 20 MG PO TABS: 20.0000 mg | ORAL_TABLET | Freq: Every day | ORAL | 6 refills | Status: DC

## 2017-12-17 NOTE — Progress Notes (Signed)
Pt here for follow up. She has been having very dry mouth and increased trouble chewing and swallowing.

## 2017-12-18 LAB — CANCER ANTIGEN 27.29: CA 27.29: 23.2 U/mL (ref 0.0–38.6)

## 2018-01-28 ENCOUNTER — Inpatient Hospital Stay: Payer: BC Managed Care – PPO | Attending: Oncology

## 2018-01-28 DIAGNOSIS — Z170 Estrogen receptor positive status [ER+]: Secondary | ICD-10-CM | POA: Diagnosis not present

## 2018-01-28 DIAGNOSIS — Z452 Encounter for adjustment and management of vascular access device: Secondary | ICD-10-CM | POA: Diagnosis present

## 2018-01-28 DIAGNOSIS — C50412 Malignant neoplasm of upper-outer quadrant of left female breast: Secondary | ICD-10-CM | POA: Insufficient documentation

## 2018-01-28 DIAGNOSIS — Z95828 Presence of other vascular implants and grafts: Secondary | ICD-10-CM

## 2018-01-28 MED ORDER — HEPARIN SOD (PORK) LOCK FLUSH 100 UNIT/ML IV SOLN
500.0000 [IU] | Freq: Once | INTRAVENOUS | Status: AC
  Administered 2018-01-28: 500 [IU] via INTRAVENOUS
  Filled 2018-01-28: qty 5

## 2018-01-28 MED ORDER — SODIUM CHLORIDE 0.9% FLUSH
10.0000 mL | Freq: Once | INTRAVENOUS | Status: AC
  Administered 2018-01-28: 10 mL via INTRAVENOUS
  Filled 2018-01-28: qty 10

## 2018-02-15 DIAGNOSIS — D509 Iron deficiency anemia, unspecified: Secondary | ICD-10-CM | POA: Insufficient documentation

## 2018-03-18 ENCOUNTER — Inpatient Hospital Stay: Payer: BC Managed Care – PPO

## 2018-03-19 ENCOUNTER — Inpatient Hospital Stay: Payer: BC Managed Care – PPO | Attending: Oncology

## 2018-03-19 DIAGNOSIS — C50412 Malignant neoplasm of upper-outer quadrant of left female breast: Secondary | ICD-10-CM | POA: Diagnosis not present

## 2018-03-19 DIAGNOSIS — Z452 Encounter for adjustment and management of vascular access device: Secondary | ICD-10-CM | POA: Diagnosis present

## 2018-03-19 MED ORDER — HEPARIN SOD (PORK) LOCK FLUSH 100 UNIT/ML IV SOLN
500.0000 [IU] | Freq: Once | INTRAVENOUS | Status: AC
  Administered 2018-03-19: 500 [IU] via INTRAVENOUS

## 2018-03-19 MED ORDER — SODIUM CHLORIDE 0.9% FLUSH
10.0000 mL | Freq: Once | INTRAVENOUS | Status: AC
  Administered 2018-03-19: 10 mL via INTRAVENOUS
  Filled 2018-03-19: qty 10

## 2018-03-23 ENCOUNTER — Encounter: Payer: Self-pay | Admitting: Anesthesiology

## 2018-03-24 ENCOUNTER — Ambulatory Visit
Admission: RE | Admit: 2018-03-24 | Discharge: 2018-03-24 | Disposition: A | Discharge status: Home / Self Care | Payer: BC Managed Care – PPO | Attending: Internal Medicine | Admitting: Internal Medicine

## 2018-03-24 ENCOUNTER — Ambulatory Visit: Payer: BC Managed Care – PPO | Admitting: Anesthesiology

## 2018-03-24 ENCOUNTER — Encounter
Admission: RE | Disposition: A | Discharge status: Home / Self Care | Payer: Self-pay | Source: Home / Self Care | Attending: Internal Medicine

## 2018-03-24 ENCOUNTER — Encounter: Payer: Self-pay | Admitting: *Deleted

## 2018-03-24 DIAGNOSIS — M810 Age-related osteoporosis without current pathological fracture: Secondary | ICD-10-CM | POA: Diagnosis not present

## 2018-03-24 DIAGNOSIS — K644 Residual hemorrhoidal skin tags: Secondary | ICD-10-CM | POA: Diagnosis not present

## 2018-03-24 DIAGNOSIS — D509 Iron deficiency anemia, unspecified: Secondary | ICD-10-CM | POA: Insufficient documentation

## 2018-03-24 DIAGNOSIS — Z7983 Long term (current) use of bisphosphonates: Secondary | ICD-10-CM | POA: Insufficient documentation

## 2018-03-24 DIAGNOSIS — Z853 Personal history of malignant neoplasm of breast: Secondary | ICD-10-CM | POA: Insufficient documentation

## 2018-03-24 DIAGNOSIS — K219 Gastro-esophageal reflux disease without esophagitis: Secondary | ICD-10-CM | POA: Diagnosis not present

## 2018-03-24 DIAGNOSIS — Z923 Personal history of irradiation: Secondary | ICD-10-CM | POA: Insufficient documentation

## 2018-03-24 DIAGNOSIS — Z79810 Long term (current) use of selective estrogen receptor modulators (SERMs): Secondary | ICD-10-CM | POA: Insufficient documentation

## 2018-03-24 DIAGNOSIS — K449 Diaphragmatic hernia without obstruction or gangrene: Secondary | ICD-10-CM | POA: Insufficient documentation

## 2018-03-24 DIAGNOSIS — G35 Multiple sclerosis: Secondary | ICD-10-CM | POA: Diagnosis not present

## 2018-03-24 DIAGNOSIS — Z7901 Long term (current) use of anticoagulants: Secondary | ICD-10-CM | POA: Diagnosis not present

## 2018-03-24 DIAGNOSIS — K642 Third degree hemorrhoids: Secondary | ICD-10-CM | POA: Insufficient documentation

## 2018-03-24 DIAGNOSIS — Z86718 Personal history of other venous thrombosis and embolism: Secondary | ICD-10-CM | POA: Insufficient documentation

## 2018-03-24 DIAGNOSIS — I10 Essential (primary) hypertension: Secondary | ICD-10-CM | POA: Diagnosis not present

## 2018-03-24 DIAGNOSIS — Z1211 Encounter for screening for malignant neoplasm of colon: Secondary | ICD-10-CM | POA: Insufficient documentation

## 2018-03-24 DIAGNOSIS — Z79899 Other long term (current) drug therapy: Secondary | ICD-10-CM | POA: Insufficient documentation

## 2018-03-24 DIAGNOSIS — Z9221 Personal history of antineoplastic chemotherapy: Secondary | ICD-10-CM | POA: Diagnosis not present

## 2018-03-24 DIAGNOSIS — Z7982 Long term (current) use of aspirin: Secondary | ICD-10-CM | POA: Insufficient documentation

## 2018-03-24 HISTORY — PX: ESOPHAGOGASTRODUODENOSCOPY (EGD) WITH PROPOFOL: SHX5813

## 2018-03-24 HISTORY — DX: Presence of other vascular implants and grafts: Z95.828

## 2018-03-24 HISTORY — PX: COLONOSCOPY WITH PROPOFOL: SHX5780

## 2018-03-24 SURGERY — ESOPHAGOGASTRODUODENOSCOPY (EGD) WITH PROPOFOL
Anesthesia: General

## 2018-03-24 MED ORDER — PROPOFOL 10 MG/ML IV BOLUS
INTRAVENOUS | Status: DC | PRN
  Administered 2018-03-24: 30 mg via INTRAVENOUS
  Administered 2018-03-24: 50 mg via INTRAVENOUS
  Administered 2018-03-24: 30 mg via INTRAVENOUS

## 2018-03-24 MED ORDER — LIDOCAINE HCL (CARDIAC) PF 100 MG/5ML IV SOSY
PREFILLED_SYRINGE | INTRAVENOUS | Status: DC | PRN
  Administered 2018-03-24: 80 mg via INTRAVENOUS

## 2018-03-24 MED ORDER — PROPOFOL 500 MG/50ML IV EMUL
INTRAVENOUS | Status: DC | PRN
  Administered 2018-03-24: 100 ug/kg/min via INTRAVENOUS

## 2018-03-24 MED ORDER — LIDOCAINE HCL (PF) 1 % IJ SOLN
INTRAMUSCULAR | Status: AC
  Administered 2018-03-24: 0.3 mL
  Filled 2018-03-24: qty 2

## 2018-03-24 MED ORDER — PROPOFOL 10 MG/ML IV BOLUS
INTRAVENOUS | Status: AC
  Filled 2018-03-24: qty 20

## 2018-03-24 MED ORDER — SODIUM CHLORIDE 0.9 % IV SOLN
INTRAVENOUS | Status: DC
  Administered 2018-03-24: 1000 mL via INTRAVENOUS
  Administered 2018-03-24: 11:00:00 via INTRAVENOUS

## 2018-03-24 NOTE — H&P (Signed)
Outpatient short stay form Pre-procedure 03/24/2018 9:52 AM [facility] Rachel Hall, M.D.  Primary Physician: Rachel Hall, M.D.  Reason for visit: Iron deficiency anemia, GERD  History of present illness:  Patient id a pleasant 53 y/o female with GERD presenting with iron deficiency anemia. Patient denies change in bowel habits, rectal bleeding, weight loss or abdominal pain.     Current Facility-Administered Medications:  .  0.9 %  sodium chloride infusion, , Intravenous, Continuous, Armoni Depass, Benay Pike, MD  Facility-Administered Medications Ordered in Other Encounters:  .  sodium chloride 0.9 % injection 10 mL, 10 mL, Intravenous, PRN, Choksi, Janak, MD, 10 mL at 06/23/14 1106  Medications Prior to Admission  Medication Sig Dispense Refill Last Dose  . amantadine (SYMMETREL) 100 MG capsule Take 100 mg by mouth daily.    03/24/2018 at Unknown time  . amLODipine (NORVASC) 5 MG tablet    03/24/2018 at Unknown time  . baclofen (LIORESAL) 10 MG tablet Take 10 mg by mouth.    Taking  . clonazePAM (KLONOPIN) 0.5 MG tablet    03/23/2018 at Unknown time  . FLUoxetine (PROZAC) 20 MG capsule Take 20 mg by mouth daily.    03/23/2018 at Unknown time  . oxybutynin (DITROPAN) 5 MG tablet Take 5 mg by mouth 2 (two) times daily.    03/23/2018 at Unknown time  . tamoxifen (NOLVADEX) 20 MG tablet Take 1 tablet (20 mg total) by mouth daily. 30 tablet 6 03/23/2018 at Unknown time  . tiZANidine (ZANAFLEX) 4 MG tablet Take 4 mg by mouth every 6 (six) hours as needed.    03/23/2018 at Unknown time  . acetaminophen (TYLENOL) 325 MG tablet Take by mouth.   Taking  . alendronate (FOSAMAX) 70 MG tablet TAKE ONE TABLET EVERY WEEK. TAKE WITH A FULL GLASS OF WATER ON AN EMPTY STOMACH. 12 tablet 0 Taking  . apixaban (ELIQUIS) 5 MG TABS tablet Take 5 mg by mouth 2 (two) times daily.    Not Taking at Unknown time  . aspirin EC 81 MG tablet Take 81 mg by mouth daily.   03/21/2018  . Calcium Carbonate-Vitamin D 600-400 MG-UNIT  per tablet Take 1 tablet by mouth 2 (two) times daily.    Taking  . omeprazole (PRILOSEC) 20 MG capsule Take 20 mg by mouth daily.    Not Taking at Unknown time  . oxyCODONE (OXY IR/ROXICODONE) 5 MG immediate release tablet Take 5 mg by mouth every 6 (six) hours as needed.    Not Taking at Unknown time     No Known Allergies   Past Medical History:  Diagnosis Date  . Breast cancer ([place]) 2015   left breast cancer  . Hypertension   . MS (multiple sclerosis) ([place])    unable to stand on her own  . Personal history of chemotherapy   . Personal history of radiation therapy   . Post-menopausal   . Presence of implanted infusion pump    right abdomen  . Status post chemotherapy    left breast cancer  . Status post radiation therapy    breast cancer    Review of systems:  Otherwise negative.    Physical Exam  Gen: Alert, oriented. Appears stated age.  HEENT: [place]/AT. PERRLA. Lungs: CTA, no wheezes. CV: RR nl S1, S2. Abd: soft, benign, no masses. BS+ Ext: No edema. Pulses 2+    Planned procedures: Proceed with EGD and colonoscopy. The patient understands the nature of the planned procedure, indications, risks, alternatives and potential complications  including but not limited to bleeding, infection, perforation, damage to internal organs and possible oversedation/side effects from anesthesia. The patient agrees and gives consent to proceed.  Please refer to procedure notes for findings, recommendations and patient disposition/instructions.     Rachel Hall K. Alice Hall, M.D. Gastroenterology 03/24/2018  9:52 AM

## 2018-03-24 NOTE — Anesthesia Preprocedure Evaluation (Addendum)
Anesthesia Evaluation  Patient identified by MRN, date of birth, ID band Patient awake    Reviewed: Allergy & Precautions, NPO status , Patient's Chart, lab work & pertinent test results, reviewed documented beta blocker date and time   Airway Mallampati: II  TM Distance: >3 FB     Dental  (+) Chipped   Pulmonary           Cardiovascular hypertension, Pt. on medications      Neuro/Psych    GI/Hepatic   Endo/Other    Renal/GU      Musculoskeletal   Abdominal   Peds  Hematology   Anesthesia Other Findings MS. Has baclophen pump.  Reproductive/Obstetrics                            Anesthesia Physical Anesthesia Plan  ASA: III  Anesthesia Plan: General   Post-op Pain Management:    Induction: Intravenous  PONV Risk Score and Plan:   Airway Management Planned:   Additional Equipment:   Intra-op Plan:   Post-operative Plan:   Informed Consent: I have reviewed the patients History and Physical, chart, labs and discussed the procedure including the risks, benefits and alternatives for the proposed anesthesia with the patient or authorized representative who has indicated his/her understanding and acceptance.       Plan Discussed with: CRNA  Anesthesia Plan Comments:         Anesthesia Quick Evaluation

## 2018-03-24 NOTE — Op Note (Signed)
[facility] Gastroenterology Patient Name: Rachel Hall Procedure Date: 03/24/2018 9:51 AM MRN: [id number] Account #: [id number] Date of Birth: Apr 14, 1965 Admit Type: Outpatient Age: 53 Room: [facility] ENDO ROOM 3 Gender: Female Note Status: Finalized Procedure:            Colonoscopy Indications:          Screening for colorectal malignant neoplasm Providers:            Benay Pike. Seven Dollens MD, MD Medicines:            Propofol per Anesthesia Complications:        No immediate complications. Procedure:            Pre-Anesthesia Assessment:                       - The risks and benefits of the procedure and the                        sedation options and risks were discussed with the                        patient. All questions were answered and informed                        consent was obtained.                       - Patient identification and proposed procedure were                        verified prior to the procedure by the nurse. The                        procedure was verified in the procedure room.                       - ASA Grade Assessment: III - A patient with severe                        systemic disease.                       - After reviewing the risks and benefits, the patient                        was deemed in satisfactory condition to undergo the                        procedure.                       After obtaining informed consent, the colonoscope was                        passed under direct vision. Throughout the procedure,                        the patient's blood pressure, pulse, and oxygen                        saturations were monitored continuously. The  Colonoscope was introduced through the anus and                        advanced to the the cecum, identified by appendiceal                        orifice and ileocecal valve. The colonoscopy was                        performed without difficulty. The patient  tolerated the                        procedure well. The quality of the bowel preparation                        was good. The ileocecal valve, appendiceal orifice, and                        rectum were photographed. Findings:      The perianal exam findings include non-thrombosed external hemorrhoids       and internal hemorrhoids that prolapse with straining, but require       manual replacement into the anal canal (Grade III).      The colon (entire examined portion) appeared normal.      Non-bleeding internal hemorrhoids were found during retroflexion. The       hemorrhoids were Grade II (internal hemorrhoids that prolapse but reduce       spontaneously).      The exam was otherwise without abnormality. Impression:           - Non-thrombosed external hemorrhoids and internal                        hemorrhoids that prolapse with straining, but require                        manual replacement into the anal canal (Grade III)                        found on perianal exam.                       - The entire examined colon is normal.                       - Non-bleeding internal hemorrhoids.                       - The examination was otherwise normal.                       - No specimens collected. Recommendation:       - Patient has a contact number available for                        emergencies. The signs and symptoms of potential                        delayed complications were discussed with the patient.  Return to normal activities tomorrow. Written discharge                        instructions were provided to the patient.                       - Resume previous diet.                       - Continue present medications.                       - Repeat colonoscopy in 10 years for screening purposes.                       - To visualize the small bowel, perform video capsule                        endoscopy at appointment to be scheduled. Procedure  Code(s):    --- Professional ---                       B1517, Colorectal cancer screening; colonoscopy on                        individual not meeting criteria for high risk Diagnosis Code(s):    --- Professional ---                       K64.4, Residual hemorrhoidal skin tags                       K64.2, Third degree hemorrhoids                       Z12.11, Encounter for screening for malignant neoplasm                        of colon CPT copyright 2018 American Medical Association. All rights reserved. The codes documented in this report are preliminary and upon coder review may  be revised to meet current compliance requirements. Efrain Sella MD, MD 03/24/2018 11:16:57 AM This report has been signed electronically. Number of Addenda: 0 Note Initiated On: 03/24/2018 9:51 AM Scope Withdrawal Time: 0 hours 5 minutes 46 seconds  Total Procedure Duration: 0 hours 12 minutes 21 seconds       [facility]

## 2018-03-24 NOTE — Interval H&P Note (Signed)
History and Physical Interval Note:  03/24/2018 9:53 AM  Rachel Hall  has presented today for surgery, with the diagnosis of IDA GERD  The various methods of treatment have been discussed with the patient and family. After consideration of risks, benefits and other options for treatment, the patient has consented to  Procedure(s): ESOPHAGOGASTRODUODENOSCOPY (EGD) WITH PROPOFOL (N/A) COLONOSCOPY WITH PROPOFOL (N/A) as a surgical intervention .  The patient's history has been reviewed, patient examined, no change in status, stable for surgery.  I have reviewed the patient's chart and labs.  Questions were answered to the patient's satisfaction.     [place], [place]

## 2018-03-24 NOTE — Anesthesia Post-op Follow-up Note (Signed)
Anesthesia QCDR form completed.        

## 2018-03-24 NOTE — Anesthesia Postprocedure Evaluation (Signed)
Anesthesia Post Note  Patient: Rachel Hall  Procedure(s) Performed: ESOPHAGOGASTRODUODENOSCOPY (EGD) WITH PROPOFOL (N/A ) COLONOSCOPY WITH PROPOFOL (N/A )  Patient location during evaluation: Endoscopy Anesthesia Type: General Level of consciousness: awake and alert Pain management: pain level controlled Vital Signs Assessment: post-procedure vital signs reviewed and stable Respiratory status: spontaneous breathing, nonlabored ventilation, respiratory function stable and patient connected to nasal cannula oxygen Cardiovascular status: blood pressure returned to baseline and stable Postop Assessment: no apparent nausea or vomiting Anesthetic complications: no     Last Vitals:  Vitals:   03/24/18 1120 03/24/18 1130  BP: 127/78 115/86  Pulse: (!) 102 98  Resp: (!) 22 (!) 21  Temp:    SpO2: 96% 97%    Last Pain:  Vitals:   03/24/18 1110  TempSrc: Tympanic                 Chanise Habeck S

## 2018-03-24 NOTE — Op Note (Signed)
[facility] Gastroenterology Patient Name: Rachel Hall Procedure Date: 03/24/2018 9:53 AM MRN: [id number] Account #: [id number] Date of Birth: [birth year] Admit Type: Outpatient Age: 53 Room: [facility] ENDO ROOM 3 Gender: Female Note Status: Finalized Procedure:            Upper GI endoscopy Indications:          Suspected upper gastrointestinal bleeding in patient                        with unexplained iron deficiency anemia Providers:            Benay Pike. Alice Reichert MD, MD Referring MD:         Ocie Cornfield. Ouida Sills MD, MD (Referring MD) Medicines:            Propofol per Anesthesia Complications:        No immediate complications. Procedure:            Pre-Anesthesia Assessment:                       - The risks and benefits of the procedure and the                        sedation options and risks were discussed with the                        patient. All questions were answered and informed                        consent was obtained.                       - Patient identification and proposed procedure were                        verified prior to the procedure by the nurse. The                        procedure was verified in the procedure room.                       - ASA Grade Assessment: III - A patient with severe                        systemic disease.                       - The risks and benefits of the procedure and the                        sedation options and risks were discussed with the                        patient. All questions were answered and informed                        consent was obtained.                       - Patient identification and proposed procedure were  verified prior to the procedure by the nurse. The                        procedure was verified in the procedure room.                       - ASA Grade Assessment: III - A patient with severe                        systemic disease.    After obtaining informed consent, the endoscope was                        passed under direct vision. Throughout the procedure,                        the patient's blood pressure, pulse, and oxygen                        saturations were monitored continuously. The Endoscope                        was introduced through the mouth, and advanced to the                        third part of duodenum. The upper GI endoscopy was                        accomplished without difficulty. The patient tolerated                        the procedure well. Findings:      The examined esophagus was normal.      No gross lesions were noted in the stomach.      Hematin (altered blood/coffee-ground-like material) was found in the       gastric fundus.      A 2 cm hiatal hernia was present.      The examined duodenum was normal.      The exam was otherwise without abnormality. Impression:           - Normal esophagus.                       - No gross lesions in the stomach.                       - Hematin (altered blood/coffee-ground-like material)                        in the gastric fundus.                       - 2 cm hiatal hernia.                       - Normal examined duodenum.                       - The examination was otherwise normal.                       - No specimens collected. Recommendation:       -  Proceed with colonoscopy Procedure Code(s):    --- Professional ---                       [phone], Esophagogastroduodenoscopy, flexible, transoral;                        diagnostic, including collection of specimen(s) by                        brushing or washing, when performed (separate procedure) Diagnosis Code(s):    --- Professional ---                       D50.9, Iron deficiency anemia, unspecified                       K44.9, Diaphragmatic hernia without obstruction or                        gangrene                       K92.2, Gastrointestinal hemorrhage, unspecified CPT  copyright 2018 American Medical Association. All rights reserved. The codes documented in this report are preliminary and upon coder review may  be revised to meet current compliance requirements. Efrain Sella MD, MD 03/24/2018 10:58:33 AM This report has been signed electronically. Number of Addenda: 0 Note Initiated On: 03/24/2018 9:53 AM      [facility]

## 2018-03-24 NOTE — Transfer of Care (Signed)
Immediate Anesthesia Transfer of Care Note  Patient: Rachel Hall  Procedure(s) Performed: ESOPHAGOGASTRODUODENOSCOPY (EGD) WITH PROPOFOL (N/A ) COLONOSCOPY WITH PROPOFOL (N/A )  Patient Location: PACU and Endoscopy Unit  Anesthesia Type:General  Level of Consciousness: awake, oriented, drowsy and patient cooperative  Airway & Oxygen Therapy: Patient Spontanous Breathing  Post-op Assessment: Report given to RN and Post -op Vital signs reviewed and stable  Post vital signs: Reviewed and stable  Last Vitals:  Vitals Value Taken Time  BP 127/78 03/24/2018 11:18 AM  Temp 36.4 C 03/24/2018 11:10 AM  Pulse 102 03/24/2018 11:20 AM  Resp 22 03/24/2018 11:20 AM  SpO2 96 % 03/24/2018 11:20 AM  Vitals shown include unvalidated device data.  Last Pain:  Vitals:   03/24/18 1110  TempSrc: Tympanic         Complications: No apparent anesthesia complications

## 2018-03-25 ENCOUNTER — Other Ambulatory Visit: Payer: Self-pay | Admitting: Internal Medicine

## 2018-03-25 ENCOUNTER — Ambulatory Visit
Admission: RE | Admit: 2018-03-25 | Discharge: 2018-03-25 | Disposition: A | Discharge status: Home / Self Care | Payer: BC Managed Care – PPO | Source: Ambulatory Visit | Attending: Internal Medicine | Admitting: Internal Medicine

## 2018-03-25 ENCOUNTER — Encounter: Payer: Self-pay | Admitting: Internal Medicine

## 2018-03-25 DIAGNOSIS — R600 Localized edema: Secondary | ICD-10-CM | POA: Insufficient documentation

## 2018-05-06 ENCOUNTER — Inpatient Hospital Stay: Payer: BC Managed Care – PPO

## 2018-05-10 ENCOUNTER — Inpatient Hospital Stay: Payer: BC Managed Care – PPO | Attending: Oncology | Admitting: Oncology

## 2018-05-10 ENCOUNTER — Other Ambulatory Visit: Payer: Self-pay

## 2018-05-10 DIAGNOSIS — I82512 Chronic embolism and thrombosis of left femoral vein: Secondary | ICD-10-CM | POA: Diagnosis not present

## 2018-05-10 DIAGNOSIS — C50412 Malignant neoplasm of upper-outer quadrant of left female breast: Secondary | ICD-10-CM

## 2018-05-10 DIAGNOSIS — M810 Age-related osteoporosis without current pathological fracture: Secondary | ICD-10-CM

## 2018-05-10 DIAGNOSIS — Z7901 Long term (current) use of anticoagulants: Secondary | ICD-10-CM

## 2018-05-10 DIAGNOSIS — G35 Multiple sclerosis: Secondary | ICD-10-CM | POA: Diagnosis not present

## 2018-05-10 DIAGNOSIS — Z79810 Long term (current) use of selective estrogen receptor modulators (SERMs): Secondary | ICD-10-CM

## 2018-05-10 NOTE — Progress Notes (Signed)
  Rachel Hall  [phone]  [phone]     Ama Mcmaster DOB: September 23, 1965  MR#: [id number]  HQI#:[id number]  Patient Care Team: Kirk Ruths, MD as PCP - General (Internal Medicine)  Virtual Visit via Telephone Note  I connected with Rachel Hall on 05/10/18 at 10:15 AM EDT by telephone and verified that I am speaking with the correct person using two identifiers.   I discussed the limitations, risks, security and privacy concerns of performing an evaluation and management service by telephone and the availability of in person appointments. I also discussed with the patient that there may be a patient responsible charge related to this service. The patient expressed understanding and agreed to proceed.   History of Present Illness:  Patient returns to clinic today as an add-on after developing a DVT in her left lower extremity approximately 6 weeks ago.  She is currently taking Eliquis without significant side effects.  She also continues to take letrozole and Fosamax.  She continues to have leg pain and swelling, but otherwise feels well.  She denies any new neurologic complaints.  She denies any recent fevers or illnesses.  She has no chest pain, shortness of breath, cough, or hemoptysis.  She has good appetite and denies weight loss.  She has no nausea, vomiting, constipation, or diarrhea.  She has no urinary complaints.  Patient otherwise feels well and offers no further specific complaints today.   Observations/Objective:  Extensive occlusive DVT in left lower extremity extending from the common femoral vein into the posterior tibial vein.   Assessment and Plan:  1. 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 She completed her chemotherapy in approximately March 2016. Treatment was discontinued early  secondary to worsening symptoms of her multiple sclerosis.  Patient was switched back to tamoxifen recently secondary to worsening osteoporosis.  Continue treatment for a total of 5 years completing in April 2021. Her most recent mammogram on September 28, 2017 was reported as BI-RADS 2.  Repeat in August 2020.  Patient has been instructed to keep her previously scheduled follow-up appointment for routine evaluation.   2. Multiple sclerosis: Chronic and unchanged.  Continue 500 mg of Rituxan every 4 months as indicated. Continue close follow-up with [place] neurology. 3. Osteoporosis: Patient's most recent bone mineral density on Jul 01, 2017 reported T score of -3.5.  This is slightly worse than 1 year prior when the reported T score was -3.2.  Continue Fosamax, calcium, vitamin D.  Patient was given a prescription for tamoxifen as above.  Repeat bone mineral density in May 2020.  4. Port:  Continue port flush every 6 weeks. 5.  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  Have recommended patient continue Eliquis lifelong.  No further interventions are needed.   Follow Up Instructions:    I discussed the assessment and treatment plan with the patient. The patient was provided an opportunity to ask questions and all were answered. The patient agreed with the plan and demonstrated an understanding of the instructions.   The patient was advised to call back or seek an in-person evaluation if the symptoms worsen or if the condition fails to improve as anticipated.  I provided 25 minutes of non-face-to-face time during this encounter.   [name], MD

## 2018-05-10 NOTE — Progress Notes (Signed)
Patient visit for follow up regarding blood clot. Patient denies concerns today.

## 2018-05-19 ENCOUNTER — Inpatient Hospital Stay: Payer: BC Managed Care – PPO | Attending: Oncology

## 2018-05-19 ENCOUNTER — Other Ambulatory Visit: Payer: Self-pay

## 2018-05-19 DIAGNOSIS — Z95828 Presence of other vascular implants and grafts: Secondary | ICD-10-CM

## 2018-05-19 DIAGNOSIS — Z452 Encounter for adjustment and management of vascular access device: Secondary | ICD-10-CM | POA: Diagnosis not present

## 2018-05-19 DIAGNOSIS — C50412 Malignant neoplasm of upper-outer quadrant of left female breast: Secondary | ICD-10-CM | POA: Diagnosis present

## 2018-05-19 MED ORDER — HEPARIN SOD (PORK) LOCK FLUSH 100 UNIT/ML IV SOLN
500.0000 [IU] | Freq: Once | INTRAVENOUS | Status: AC
  Administered 2018-05-19: 500 [IU] via INTRAVENOUS
  Filled 2018-05-19: qty 5

## 2018-05-19 MED ORDER — SODIUM CHLORIDE 0.9% FLUSH
10.0000 mL | Freq: Once | INTRAVENOUS | Status: AC
  Administered 2018-05-19: 12:00:00 10 mL via INTRAVENOUS
  Filled 2018-05-19: qty 10

## 2018-07-05 NOTE — Progress Notes (Signed)
[place]  [phone]  [phone]     Rachel Hall DOB: Jan 19, 1966  MR#: [id number]  BSJ#:[id number]  Patient Care Team: Kirk Ruths, MD as PCP - General (Internal Medicine)  I connected with Rachel Hall on 07/09/18 at 10:30 AM EDT by telephone visit and verified that I am speaking with the correct person using two identifiers.   I discussed the limitations, risks, security and privacy concerns of performing an evaluation and management service by telemedicine and the availability of in-person appointments. I also discussed with the patient that there may be a patient responsible charge related to this service. The patient expressed understanding and agreed to proceed.   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  INTERVAL HISTORY: Patient agreed to evaluation via telephone for her routine 54-monthevaluation.  She continues to tolerate letrozole, Fosamax, and Eliquis well without significant side effects. She continues to have chronic weakness and fatigue secondary to her multiple sclerosis.  She denies any new neurologic complaints.  She denies any recent fevers or illnesses.  She denies any chest pain, shortness of breath, cough, or hemoptysis.  She has a good appetite and denies weight loss. She denies any nausea, vomiting, constipation, or diarrhea. She has no urinary complaints.  Patient feels at her baseline offers no specific complaints today.  REVIEW OF SYSTEMS:   Review of Systems  Constitutional: Positive for malaise/fatigue. Negative for fever and weight loss.  Respiratory: Negative.  Negative for cough and shortness of breath.   Cardiovascular: Negative.  Negative for chest pain and leg swelling.    Gastrointestinal: Negative.  Negative for abdominal pain and constipation.  Genitourinary: Negative.  Negative for dysuria.  Musculoskeletal: Negative.  Negative for back pain.  Skin: Negative.  Negative for rash.  Neurological: Positive for tremors, sensory change, focal weakness and weakness.  Psychiatric/Behavioral: Negative.  Negative for depression. The patient is not nervous/anxious.     As per HPI. Otherwise, a complete review of systems is negative.  PAST MEDICAL HISTORY: Past Medical History:  Diagnosis Date   [place]   left breast cancer   Hypertension    MS (multiple sclerosis) ([facility]    unable to stand on her own   Personal history of chemotherapy    Personal history of radiation therapy    Post-menopausal    Presence of implanted infusion pump    right abdomen   Status post chemotherapy    left breast cancer   Status post radiation therapy    breast cancer    PAST SURGICAL HISTORY: Past Surgical History:  Procedure Laterality Date   BREAST BIOPSY Left    positive 2015   BREAST LUMPECTOMY Left 2015   invasive, f/u radiation.    COLONOSCOPY WITH PROPOFOL N/A 03/24/2018   Procedure: COLONOSCOPY WITH PROPOFOL;  Surgeon: Toledo, TBenay Pike MD;  Location: [facility] ENDOSCOPY;  Service: Gastroenterology;  Laterality: N/A;   ESOPHAGOGASTRODUODENOSCOPY (EGD) WITH PROPOFOL N/A 03/24/2018   Procedure: ESOPHAGOGASTRODUODENOSCOPY (EGD) WITH PROPOFOL;  Surgeon: Toledo, TBenay Pike MD;  Location: [facility] ENDOSCOPY;  Service: Gastroenterology;  Laterality: N/A;    FAMILY HISTORY Family History  Problem Relation Age of Onset   Breast cancer Neg Hx     GYNECOLOGIC HISTORY:  Patient's last menstrual period was 03/25/2011.  ADVANCED DIRECTIVES:    HEALTH MAINTENANCE: Social History   Tobacco Use   Smoking status: Never Smoker  Substance Use Topics   Alcohol use: No   Drug use: No     Colonoscopy:  PAP:  Bone density:  Lipid  panel:  No Known Allergies  Current Outpatient Medications  Medication Sig Dispense Refill   acetaminophen (TYLENOL) 325 MG tablet Take by mouth.     alendronate (FOSAMAX) 70 MG tablet TAKE ONE TABLET EVERY WEEK. TAKE WITH A FULL GLASS OF WATER ON AN EMPTY STOMACH. 12 tablet 0   amantadine (SYMMETREL) 100 MG capsule Take 100 mg by mouth daily.      amLODipine (NORVASC) 5 MG tablet      apixaban (ELIQUIS) 5 MG TABS tablet Take 5 mg by mouth 2 (two) times daily.      baclofen (LIORESAL) 10 MG tablet Take 10 mg by mouth.      Calcium Carbonate-Vitamin D 600-400 MG-UNIT per tablet Take 1 tablet by mouth 2 (two) times daily.      clonazePAM (KLONOPIN) 0.5 MG tablet      FLUoxetine (PROZAC) 20 MG capsule Take 20 mg by mouth daily.      modafinil (PROVIGIL) 100 MG tablet Take 1 tablet by mouth 1 day or 1 dose.     omeprazole (PRILOSEC) 20 MG capsule Take 20 mg by mouth daily.      oxybutynin (DITROPAN) 5 MG tablet Take 5 mg by mouth 2 (two) times daily.      tamoxifen (NOLVADEX) 20 MG tablet TAKE 1 TABLET BY MOUTH DAILY 90 tablet 0   No current facility-administered medications for this visit.    Facility-Administered Medications Ordered in Other Visits  Medication Dose Route Frequency Provider Last Rate Last Dose   sodium chloride 0.9 % injection 10 mL  10 mL Intravenous PRN Choksi, Delorise Shiner, MD   10 mL at 06/23/14 1106    OBJECTIVE: LMP 03/25/2011    There is no height or weight on file to calculate BMI.    ECOG FS:2 - Symptomatic, <50% confined to bed   LAB RESULTS:  No visits with results within 3 Day(s) from this visit.  Latest known visit with results is:  Appointment on 12/17/2017  Component Date Value Ref Range Status   CA 27.29 12/17/2017 23.2  0.0 - 38.6 U/mL Final   Comment: (NOTE) Siemens Centaur Immunochemiluminometric Methodology (ICMA) Values obtained with different assay methods or kits cannot be used interchangeably. Results cannot be interpreted as  absolute evidence of the presence or absence of malignant disease. Performed At: [facility] [place], [place] [id number] Rush Farmer MD GT:[id number]     STUDIES: Dg Bone Density  Result Date: 07/07/2018 EXAM: DUAL X-RAY ABSORPTIOMETRY (DXA) FOR BONE MINERAL DENSITY IMPRESSION: Technologist: SCE PATIENT BIOGRAPHICAL: Name: Special, Ranes Patient ID: [id number] Birth Date: July 18, 1965 Height: 60.5 in. Gender: Female Exam Date: 07/07/2018 Weight: 115.0 lbs. Indications: Caucasian, Family Hx of Osteoporosis, Height Loss, High Risk Meds, History of Breast Cancer, History of Fracture (Adult), History of Osteoporosis, MS, Postmenopausal, surgical induced menopause Fractures: right tib/fib Treatments: Alendronate, ASPRIN 81 MG, CALCIUM VIT D, Omeprazole, Prednisone ASSESSMENT: The BMD measured at Femur Total Right is 0.558 g/cm2 with a T-score of -3.6. This patient is considered osteoporotic according to [facility]) criteria. The quality of the scan was suboptimal due to patient positioning. Site Region Measured Measured WHO Young Adult BMD Date       Age  Classification T-score AP Spine L1-L4 07/07/2018 53.7 Osteopenia -1.1 1.052 g/cm2 AP Spine L1-L4 07/01/2017 52.7 Osteopenia -2.3 0.907 g/cm2 AP Spine L1-L4 06[age]25/2016 51.7 Osteopenia -1.8 0.978 g/cm2 AP Spine L1-L4 06/14/2015 50.7 Osteopenia -1.8 0.975 g/cm2 DualFemur Total Right 07/07/2018 53.7 Osteoporosis -3.6 0.558 g/cm2 DualFemur Total Right 07/01/2017 52.7 Osteoporosis -3.5 0.562 g/cm2 DualFemur Total Right 06/25/2016 51.7 Osteoporosis -3.2 0.610 g/cm2 DualFemur Total Right 06/14/2015 50.7 Osteoporosis -3.0 0.626 g/cm2 DualFemur Total Mean 07/07/2018 53.7 Osteoporosis -3.2 0.600 g/cm2 DualFemur Total Mean 07/01/2017 52.7 Osteoporosis -3.3 0.588 g/cm2 DualFemur Total Mean 06/25/2016 51.7 Osteoporosis -2.8 0.654 g/cm2 DualFemur Total Mean 06/14/2015 50.7 Osteoporosis -2.9 0.638 g/cm2 World Health  Organization [facility]) criteria for post-menopausal, Caucasian Women: Normal:       T-score at or above -1 SD Osteopenia:   T-score between -1 and -2.5 SD Osteoporosis: T-score at or below -2.5 SD RECOMMENDATIONS: 1. All patients should optimize calcium and vitamin D intake. 2. Consider FDA-approved medical therapies in postmenopausal women and men aged 70 years and older, based on the following: a. A hip or vertebral(clinical or morphometric) fracture b. T-score < -2.5 at the femoral neck or spine after appropriate evaluation to exclude secondary causes c. Low bone mass (T-score between -1.0 and -2.5 at the femoral neck or spine) and a 10-year probability of a hip fracture > 3% or a 10-year probability of a major osteoporosis-related fracture > 20% based on the [place]-adapted WHO algorithm d. Clinician judgment and/or patient preferences may indicate treatment for people with 10-year fracture probabilities above or below these levels FOLLOW-UP: People with diagnosed cases of osteoporosis or at high risk for fracture should have regular bone mineral density tests. For patients eligible for Medicare, routine testing is allowed once every 2 years. The testing frequency can be increased to one year for patients who have rapidly progressing disease, those who are receiving or discontinuing medical therapy to restore bone mass, or have additional risk factors. Electronically Signed   By: Earle Gell M.D.   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 She completed her chemotherapy in approximately March 2016. Treatment was discontinued early secondary to worsening symptoms of her multiple sclerosis.  Patient was initially on tamoxifen, but now is  currently on letrozole.  Because of patient's worsening osteoporosis, will transition patient back to tamoxifen.  Continue treatment for a total of 5 years completing in April 2021.  Her most recent mammogram on September 28, 2017 was reported as BI-RADS 2.  Repeat in August 2020.  Return to clinic in 6 months for routine evaluation. 2. Multiple sclerosis: Chronic and unchanged.  Continue 500 mg of Rituxan every 4 months as indicated. Continue close follow-up with [place] neurology. 3. Osteoporosis: Patient's most recent bone mineral density on Jul 07, 2018 reported T score of -3.6 which is essentially unchanged from 1 year prior.  Letrozole has been switched to tamoxifen.  Continue Fosamax, calcium, and vitamin D. 4.  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  Previously recommended continue Eliquis lifelong.  5. Port:  Continue port flush every 6 weeks.  I provided 25 minutes of non face-to-face telephone visit time during this encounter,  and > 50% was spent counseling as documented under my assessment & plan.   Patient expressed understanding and was in agreement with this plan. She also understands that She can call clinic at any time with any questions, concerns, or complaints.    Breast CA [facility])   Staging form: Breast, AJCC 7th Edition     Clinical stage from 09/09/2014: Stage IIA (T1c, N1, M0) - Signed by [name], MD on 09/09/2014   [name], MD   07/09/2018 8:53 AM

## 2018-07-06 ENCOUNTER — Other Ambulatory Visit: Payer: Self-pay | Admitting: Oncology

## 2018-07-07 ENCOUNTER — Other Ambulatory Visit: Payer: Self-pay

## 2018-07-07 ENCOUNTER — Ambulatory Visit
Admission: RE | Admit: 2018-07-07 | Discharge: 2018-07-07 | Disposition: A | Discharge status: Home / Self Care | Payer: BC Managed Care – PPO | Source: Ambulatory Visit | Attending: Oncology | Admitting: Oncology

## 2018-07-07 DIAGNOSIS — C50412 Malignant neoplasm of upper-outer quadrant of left female breast: Secondary | ICD-10-CM | POA: Insufficient documentation

## 2018-07-07 DIAGNOSIS — Z13820 Encounter for screening for osteoporosis: Secondary | ICD-10-CM | POA: Insufficient documentation

## 2018-07-07 DIAGNOSIS — Z780 Asymptomatic menopausal state: Secondary | ICD-10-CM | POA: Diagnosis not present

## 2018-07-08 ENCOUNTER — Encounter: Payer: Self-pay | Admitting: Oncology

## 2018-07-08 ENCOUNTER — Other Ambulatory Visit: Payer: Self-pay

## 2018-07-08 ENCOUNTER — Inpatient Hospital Stay: Payer: BC Managed Care – PPO | Attending: Oncology | Admitting: Oncology

## 2018-07-08 DIAGNOSIS — Z86718 Personal history of other venous thrombosis and embolism: Secondary | ICD-10-CM | POA: Diagnosis not present

## 2018-07-08 DIAGNOSIS — M810 Age-related osteoporosis without current pathological fracture: Secondary | ICD-10-CM

## 2018-07-08 DIAGNOSIS — G35 Multiple sclerosis: Secondary | ICD-10-CM

## 2018-07-08 DIAGNOSIS — C50412 Malignant neoplasm of upper-outer quadrant of left female breast: Secondary | ICD-10-CM | POA: Diagnosis not present

## 2018-07-08 DIAGNOSIS — Z7901 Long term (current) use of anticoagulants: Secondary | ICD-10-CM

## 2018-07-08 DIAGNOSIS — Z170 Estrogen receptor positive status [ER+]: Secondary | ICD-10-CM | POA: Diagnosis not present

## 2018-07-08 DIAGNOSIS — I824Y2 Acute embolism and thrombosis of unspecified deep veins of left proximal lower extremity: Secondary | ICD-10-CM

## 2018-07-08 DIAGNOSIS — Z79810 Long term (current) use of selective estrogen receptor modulators (SERMs): Secondary | ICD-10-CM

## 2018-07-08 NOTE — Progress Notes (Signed)
Patient stated that she had been doing well with no complaints. Patient had her Bone density yesterday 07/07/2018 and last mammogram was done on 09/28/2017.

## 2018-07-12 ENCOUNTER — Ambulatory Visit: Payer: BC Managed Care – PPO | Admitting: Oncology

## 2018-09-29 ENCOUNTER — Other Ambulatory Visit: Payer: Self-pay | Admitting: Oncology

## 2018-09-30 ENCOUNTER — Ambulatory Visit
Admission: RE | Admit: 2018-09-30 | Discharge: 2018-09-30 | Disposition: A | Discharge status: Home / Self Care | Payer: BC Managed Care – PPO | Source: Ambulatory Visit | Attending: Oncology | Admitting: Oncology

## 2018-09-30 DIAGNOSIS — C50412 Malignant neoplasm of upper-outer quadrant of left female breast: Secondary | ICD-10-CM

## 2018-12-09 ENCOUNTER — Inpatient Hospital Stay: Payer: BC Managed Care – PPO | Attending: Oncology

## 2018-12-09 ENCOUNTER — Other Ambulatory Visit: Payer: Self-pay

## 2018-12-09 DIAGNOSIS — Z7901 Long term (current) use of anticoagulants: Secondary | ICD-10-CM | POA: Diagnosis not present

## 2018-12-09 DIAGNOSIS — R531 Weakness: Secondary | ICD-10-CM | POA: Diagnosis not present

## 2018-12-09 DIAGNOSIS — G35 Multiple sclerosis: Secondary | ICD-10-CM | POA: Insufficient documentation

## 2018-12-09 DIAGNOSIS — Z95828 Presence of other vascular implants and grafts: Secondary | ICD-10-CM

## 2018-12-09 DIAGNOSIS — R208 Other disturbances of skin sensation: Secondary | ICD-10-CM | POA: Insufficient documentation

## 2018-12-09 DIAGNOSIS — C50412 Malignant neoplasm of upper-outer quadrant of left female breast: Secondary | ICD-10-CM | POA: Insufficient documentation

## 2018-12-09 DIAGNOSIS — R5383 Other fatigue: Secondary | ICD-10-CM | POA: Diagnosis not present

## 2018-12-09 DIAGNOSIS — Z79811 Long term (current) use of aromatase inhibitors: Secondary | ICD-10-CM | POA: Diagnosis not present

## 2018-12-09 DIAGNOSIS — Z79899 Other long term (current) drug therapy: Secondary | ICD-10-CM | POA: Diagnosis not present

## 2018-12-09 DIAGNOSIS — R251 Tremor, unspecified: Secondary | ICD-10-CM | POA: Diagnosis not present

## 2018-12-09 DIAGNOSIS — Z170 Estrogen receptor positive status [ER+]: Secondary | ICD-10-CM | POA: Diagnosis not present

## 2018-12-09 DIAGNOSIS — Z86718 Personal history of other venous thrombosis and embolism: Secondary | ICD-10-CM | POA: Diagnosis not present

## 2018-12-09 DIAGNOSIS — M810 Age-related osteoporosis without current pathological fracture: Secondary | ICD-10-CM | POA: Insufficient documentation

## 2018-12-09 MED ORDER — HEPARIN SOD (PORK) LOCK FLUSH 100 UNIT/ML IV SOLN
500.0000 [IU] | Freq: Once | INTRAVENOUS | Status: AC
  Administered 2018-12-09: 10:00:00 500 [IU] via INTRAVENOUS

## 2018-12-09 MED ORDER — SODIUM CHLORIDE 0.9% FLUSH
10.0000 mL | Freq: Once | INTRAVENOUS | Status: AC
  Administered 2018-12-09: 10:00:00 10 mL via INTRAVENOUS
  Filled 2018-12-09: qty 10

## 2018-12-25 ENCOUNTER — Other Ambulatory Visit: Payer: Self-pay | Admitting: Oncology

## 2019-01-05 NOTE — Progress Notes (Signed)
Rachel Hall  [phone]  [phone]     Rachel Hall DOB: Jun 29, 1965  MR#: [id number]  WTU#:[id number]  Patient Care Team: Kirk Ruths, MD as PCP - General (Internal Medicine)  I connected with [name] on 01/15/19 at  9:30 AM EST by video enabled telemedicine visit and verified that I am speaking with the correct person using two identifiers.   I discussed the limitations, risks, security and privacy concerns of performing an evaluation and management service by telemedicine and the availability of in-person appointments. I also discussed with the patient that there may be a patient responsible charge related to this service. The patient expressed understanding and agreed to proceed.   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  INTERVAL HISTORY: Patient agreed to video enabled telemedicine visit for her routine 55-monthevaluation.  She continues to tolerate tamoxifen and Fosamax well without significant side effects.  She continues to have chronic weakness and fatigue.  She has no new neurologic complaints related to her multiple sclerosis.  She denies any recent fevers or illnesses.  She denies any chest pain, shortness of breath, cough, or hemoptysis.  She has a good appetite and denies weight loss. She denies any nausea, vomiting, constipation, or diarrhea. She has no urinary complaints.  Patient offers no specific complaints today.  REVIEW OF SYSTEMS:   Review of Systems  Constitutional: Positive for malaise/fatigue. Negative for fever and weight loss.  Respiratory: Negative.  Negative for cough and shortness of breath.   Cardiovascular: Negative.  Negative for chest pain and leg swelling.   Gastrointestinal: Negative.  Negative for abdominal pain and constipation.  Genitourinary: Negative.  Negative for dysuria.  Musculoskeletal: Negative.  Negative for back pain.  Skin: Negative.  Negative for rash.  Neurological: Positive for tremors, sensory change, focal weakness and weakness.  Psychiatric/Behavioral: Negative.  Negative for depression. The patient is not nervous/anxious.     As per HPI. Otherwise, a complete review of systems is negative.  PAST MEDICAL HISTORY: Past Medical History:  Diagnosis Date  . Breast cancer ([facility] [facility] 2015   left breast cancer  . Hypertension   . MS (multiple sclerosis) ([facility]    unable to stand on her own  . Personal history of chemotherapy   . Personal history of radiation therapy   . Post-menopausal   . Presence of implanted infusion pump    right abdomen  . Status post chemotherapy    left breast cancer  . Status post radiation therapy    breast cancer    PAST SURGICAL HISTORY: Past Surgical History:  Procedure Laterality Date  . BREAST BIOPSY Left    positive 2015  . BREAST LUMPECTOMY Left 2015   invasive, f/u radiation.   . COLONOSCOPY WITH PROPOFOL N/A 03/24/2018   Procedure: COLONOSCOPY WITH PROPOFOL;  Surgeon: Toledo, TBenay Pike MD;  Location: [facility] ENDOSCOPY;  Service: Gastroenterology;  Laterality: N/A;  . ESOPHAGOGASTRODUODENOSCOPY (EGD) WITH PROPOFOL N/A 03/24/2018   Procedure: ESOPHAGOGASTRODUODENOSCOPY (EGD) WITH PROPOFOL;  Surgeon: Toledo, TBenay Pike MD;  Location: [facility] ENDOSCOPY;  Service: Gastroenterology;  Laterality: N/A;    FAMILY HISTORY Family History  Problem Relation Age of Onset  . Breast cancer Neg Hx     GYNECOLOGIC HISTORY:  Patient's last menstrual period was 03/25/2011.  ADVANCED DIRECTIVES:    HEALTH MAINTENANCE: Social History   Tobacco Use  . Smoking status: Never Smoker  Substance Use Topics  . Alcohol use: No  . Drug use: No     Colonoscopy:  PAP:  Bone density:  Lipid panel:   No Known Allergies  Current Outpatient Medications  Medication Sig Dispense Refill  . acetaminophen (TYLENOL) 325 MG tablet Take by mouth.    Marland Kitchen alendronate (FOSAMAX) 70 MG tablet TAKE ONE TABLET EVERY WEEK. TAKE WITH A FULL GLASS OF WATER ON AN EMPTY STOMACH. 12 tablet 0  . amantadine (SYMMETREL) 100 MG capsule Take 100 mg by mouth daily.     Marland Kitchen amLODipine (NORVASC) 5 MG tablet     . apixaban (ELIQUIS) 5 MG TABS tablet Take 5 mg by mouth 2 (two) times daily.     . baclofen (LIORESAL) 10 MG tablet Take 10 mg by mouth.     . Calcium Carbonate-Vitamin D 600-400 MG-UNIT per tablet Take 1 tablet by mouth 2 (two) times daily.     . clonazePAM (KLONOPIN) 0.5 MG tablet     . FLUoxetine (PROZAC) 20 MG capsule Take 20 mg by mouth daily.     Marland Kitchen omeprazole (PRILOSEC) 20 MG capsule Take 20 mg by mouth daily.     Marland Kitchen oxybutynin (DITROPAN) 5 MG tablet Take 5 mg by mouth 2 (two) times daily.     . modafinil (PROVIGIL) 100 MG tablet Take 1 tablet by mouth 1 day or 1 dose.    . tamoxifen (NOLVADEX) 20 MG tablet TAKE 1 TABLET BY MOUTH DAILY (Patient not taking: Reported on 01/13/2019) 90 tablet 2   No current facility-administered medications for this visit.    Facility-Administered Medications Ordered in Other Visits  Medication Dose Route Frequency Provider Last Rate Last Dose  . sodium chloride 0.9 % injection 10 mL  10 mL Intravenous PRN Forest Gleason, MD   10 mL at 06/23/14 1106    OBJECTIVE: LMP 03/25/2011    There is no height or weight on file to calculate BMI.    ECOG FS:2 - Symptomatic, <50% confined to bed  General: Well-developed, well-nourished, no acute distress. HEENT: Normocephalic. Neuro: Alert, answering all questions appropriately. Cranial nerves grossly intact. Psych: Normal affect.  LAB RESULTS:  No visits with results within 3 Day(s) from this visit.  Latest known visit with results is:  Appointment on 12/17/2017  Component Date Value Ref Range Status  . CA 27.29 12/17/2017  23.2  0.0 - 38.6 U/mL Final   Comment: (NOTE) Siemens Centaur Immunochemiluminometric Methodology [facility]) Values obtained with different assay methods or kits cannot be used interchangeably. Results cannot be interpreted as absolute evidence of the presence or absence of malignant disease. Performed At: [facility] [place], [place] [id number] Rush Farmer MD TF:[id number]     STUDIES: No results found.  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 She completed her chemotherapy in approximately March 2016. Treatment was discontinued early secondary to worsening symptoms of her multiple sclerosis.  Patient was recently switched back to tamoxifen secondary to her osteoporosis and will complete 5 years of treatment in April 2021.  She has been instructed to continue tamoxifen until her next clinic visit.  Her most recent  mammogram on Jul 07, 2018 was reported BI-RADS 2.  Repeat in May 2021.  Return to clinic in 6 months for video assisted telemedicine visit and further evaluation.  2. Multiple sclerosis: Chronic and unchanged.  Continue 500 mg of Rituxan every 4 months as indicated. Continue close follow-up with [place] [facility]. 3. Osteoporosis: Patient's most recent bone mineral density on Jul 07, 2018 reported T score of -3.6 which is essentially unchanged from 1 year prior.  Letrozole has been switched to tamoxifen.  Continue Fosamax, calcium, and vitamin D.  Repeat in May 2021. 4.  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  Previously recommended continue Eliquis lifelong.  5. Port:  Continue port flush every 6 weeks.  I provided 15  minutes of face-to-face video visit time during this encounter, and > 50% was spent counseling as documented under my assessment & plan.   Patient expressed understanding and was in agreement with this plan. She also understands that She can call clinic at any time with any questions, concerns, or complaints.    Breast CA [facility])   Staging form: Breast, AJCC 7th Edition     Clinical stage from 09/09/2014: Stage IIA (T1c, N1, M0) - Signed by [name], MD on 09/09/2014   [name], MD   01/15/2019 7:30 AM

## 2019-01-13 ENCOUNTER — Encounter: Payer: Self-pay | Admitting: Oncology

## 2019-01-13 NOTE — Progress Notes (Signed)
Follow-up virtual visit for hx of breast cancer.

## 2019-01-14 ENCOUNTER — Inpatient Hospital Stay: Payer: BC Managed Care – PPO | Attending: Oncology | Admitting: Oncology

## 2019-01-14 DIAGNOSIS — C50412 Malignant neoplasm of upper-outer quadrant of left female breast: Secondary | ICD-10-CM

## 2019-07-08 NOTE — Progress Notes (Signed)
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  INTERVAL HISTORY: Patient returns to clinic today for routine 55-monthevaluation.  She continues to tolerate tamoxifen and Fosamax well without significant side effects.  She has no progression of her multiple sclerosis.  She has chronic weakness and fatigue.  She has no new neurologic complaints related to her multiple sclerosis.  She denies any recent fevers or illnesses.  She denies any chest pain, shortness of breath, cough, or hemoptysis.  She has a good appetite and denies weight loss. She denies any nausea, vomiting, constipation, or diarrhea. She has no urinary complaints.  Patient offers no further specific complaints today.  REVIEW OF SYSTEMS:   Review of Systems  Constitutional: Positive for malaise/fatigue. Negative for fever and weight loss.  Respiratory: Negative.  Negative for cough and shortness of breath.   Cardiovascular: Negative.  Negative for chest pain and leg swelling.  Gastrointestinal: Negative.  Negative for abdominal pain and constipation.  Genitourinary: Negative.  Negative for dysuria.  Musculoskeletal: Negative.  Negative for back pain.  Skin: Negative.  Negative for rash.  Neurological: Positive for tremors, sensory change, focal weakness and weakness.  Psychiatric/Behavioral: Negative.  Negative for depression. The patient is not nervous/anxious.     As per HPI. Otherwise, a complete review of systems is negative.  PAST MEDICAL HISTORY: Past Medical History:  Diagnosis Date  . Breast cancer ([facility] 2015   left breast cancer  . Hypertension   . MS  (multiple sclerosis) ([facility]    unable to stand on her own  . Personal history of chemotherapy   . Personal history of radiation therapy   . Post-menopausal   . Presence of implanted infusion pump    right abdomen  . Status post chemotherapy    left breast cancer  . Status post radiation therapy    breast cancer    PAST SURGICAL HISTORY: Past Surgical History:  Procedure Laterality Date  . BREAST BIOPSY Left    positive 2015  . BREAST LUMPECTOMY Left 2015   invasive, f/u radiation.   . COLONOSCOPY WITH PROPOFOL N/A 03/24/2018   Procedure: COLONOSCOPY WITH PROPOFOL;  Surgeon: Toledo, TBenay Pike MD;  Location: [facility] ENDOSCOPY;  Service: Gastroenterology;  Laterality: N/A;  . ESOPHAGOGASTRODUODENOSCOPY (EGD) WITH PROPOFOL N/A 03/24/2018   Procedure: ESOPHAGOGASTRODUODENOSCOPY (EGD) WITH PROPOFOL;  Surgeon: Toledo, TBenay Pike MD;  Location: [facility] ENDOSCOPY;  Service: Gastroenterology;  Laterality: N/A;    FAMILY HISTORY Family History  Problem Relation Age of Onset  . Breast cancer Neg Hx     GYNECOLOGIC HISTORY:  Patient's last menstrual period was 03/25/2011.     ADVANCED DIRECTIVES:    HEALTH MAINTENANCE: Social History   Tobacco Use  . Smoking status: Never Smoker  . Smokeless tobacco: Never Used  Substance Use Topics  . Alcohol use: No  . Drug use: No     Colonoscopy:  PAP:  Bone density:  Lipid panel:  No Known Allergies  Current Outpatient Medications  Medication Sig Dispense Refill  . acetaminophen (TYLENOL) 325 MG tablet Take by mouth.    .Marland Kitchenalendronate (FOSAMAX) 70 MG tablet TAKE ONE TABLET EVERY WEEK. TAKE WITH A  FULL GLASS OF WATER ON AN EMPTY STOMACH. 12 tablet 0  . amantadine (SYMMETREL) 100 MG capsule Take 100 mg by mouth daily.     Marland Kitchen amLODipine (NORVASC) 5 MG tablet     . apixaban (ELIQUIS) 5 MG TABS tablet Take 5 mg by mouth 2 (two) times daily.     . baclofen (LIORESAL) 10 MG tablet Take 10 mg by mouth.     . Calcium Carbonate-Vitamin D 600-400  MG-UNIT per tablet Take 1 tablet by mouth 2 (two) times daily.     . clonazePAM (KLONOPIN) 0.5 MG tablet     . FLUoxetine (PROZAC) 20 MG capsule Take 20 mg by mouth daily.     . modafinil (PROVIGIL) 100 MG tablet Take 1 tablet by mouth 1 day or 1 dose.    Marland Kitchen omeprazole (PRILOSEC) 20 MG capsule Take 20 mg by mouth daily.     Marland Kitchen oxybutynin (DITROPAN) 5 MG tablet Take 5 mg by mouth 2 (two) times daily.     . tamoxifen (NOLVADEX) 20 MG tablet TAKE 1 TABLET BY MOUTH DAILY 90 tablet 2   No current facility-administered medications for this visit.   Facility-Administered Medications Ordered in Other Visits  Medication Dose Route Frequency Provider Last Rate Last Admin  . sodium chloride 0.9 % injection 10 mL  10 mL Intravenous PRN Choksi, Delorise Shiner, MD   10 mL at 06/23/14 1106    OBJECTIVE: BP 111/81 (BP Location: Left Arm, Patient Position: Sitting, Cuff Size: Normal)   Pulse 83   Temp (!) 97.5 F (36.4 C) (Tympanic)   Resp 20   Wt 128 lb (58.1 kg)   LMP 03/25/2011   SpO2 99%   BMI 23.41 kg/m    Body mass index is 23.41 kg/m.    ECOG FS:2 - Symptomatic, <50% confined to bed  General: Well-developed, well-nourished, no acute distress.  Sitting in wheelchair. Eyes: Pink conjunctiva, anicteric sclera. HEENT: Normocephalic, moist mucous membranes. Lungs: No audible wheezing or coughing. Heart: Regular rate and rhythm. Abdomen: Soft, nontender, no obvious distention. Musculoskeletal: No edema, cyanosis, or clubbing. Neuro: Alert, answering all questions appropriately. Cranial nerves grossly intact. Skin: No rashes or petechiae noted. Psych: Normal affect.  LAB RESULTS:  No visits with results within 3 Day(s) from this visit.  Latest known visit with results is:  Appointment on 12/17/2017  Component Date Value Ref Range Status  . CA 27.29 12/17/2017 23.2  0.0 - 38.6 U/mL Final   Comment: (NOTE) Siemens Centaur Immunochemiluminometric Methodology [facility]) Values obtained with different  assay methods or kits cannot be used interchangeably. Results cannot be interpreted as absolute evidence of the presence or absence of malignant disease. Performed At: [facility] [place], [place] [id number] Rush Farmer MD CM:[id number]     STUDIES: DG Bone Density  Result Date: 07/12/2019 EXAM: DUAL X-RAY ABSORPTIOMETRY (DXA) FOR BONE MINERAL DENSITY IMPRESSION: Your patient Anastasha Ortez completed a BMD test on 07/12/2019 using the Christian (software version: 14.10) manufactured by [employer]. The following summarizes the results of our evaluation. Technologist: vlm PATIENT BIOGRAPHICAL: Name: Zahraa, Bhargava Patient ID: [id number] Birth Date: 27-Oct-1965 Height: 62.0 in. Gender: Female Exam Date: 07/12/2019 Weight: 117.0 lbs. Indications: Family Hx of Osteoporosis, Height Loss, History of Fracture (Adult), High Risk Meds, surgical induced menopause, Caucasian, Postmenopausal, History of Osteoporosis, History of Breast Cancer, MS Fractures: right tib/fib Treatments: Alendronate, ASPRIN 81 MG, CALCIUM VIT D, Prednisone DENSITOMETRY RESULTS: Site  Region     Measured Date Measured Age WHO Classification Young Adult T-score BMD         %Change vs. Previous Significant Change (*) AP Spine L1-L4 07/12/2019 54.7 Osteopenia -1.5 1.007 g/cm2 -4.3% Yes AP Spine L1-L4 07/07/2018 53.7 Osteopenia -1.1 1.052 g/cm2 16.0% Yes AP Spine L1-L4 07/01/2017 52.7 Osteopenia -2.3 0.907 g/cm2 -7.3% Yes AP Spine L1-L4 06/25/2016 51.7 Osteopenia -1.8 0.978 g/cm2 0.3% - AP Spine L1-L4 06/14/2015 50.7 Osteopenia -1.8 0.975 g/cm2 - - DualFemur Neck Right 07/12/2019 54.7 Osteoporosis -2.8 0.645 g/cm2 1.4% - DualFemur Neck Right 07/07/2018 53.7 Osteoporosis -2.9 0.636 g/cm2 -0.8% - DualFemur Neck Right 07/01/2017 52.7 Osteoporosis -2.9 0.641 g/cm2 0.3% - DualFemur Neck Right 06/25/2016 51.7 Osteoporosis -2.9 0.639 g/cm2 -2.4% - DualFemur Neck Right 06/14/2015 50.7 Osteoporosis  -2.8 0.655 g/cm2 - - DualFemur Total Mean 07/12/2019 54.7 Osteoporosis -3.2 0.603 g/cm2 0.5% - DualFemur Total Mean 07/07/2018 53.7 Osteoporosis -3.2 0.600 g/cm2 2.0% - DualFemur Total Mean 07/01/2017 52.7 Osteoporosis -3.3 0.588 g/cm2 -10.1% Yes DualFemur Total Mean 06/25/2016 51.7 Osteoporosis -2.8 0.654 g/cm2 2.5% - DualFemur Total Mean 06/14/2015 50.7 Osteoporosis -2.9 0.638 g/cm2 - - Left Forearm Radius 33% 07/12/2019 54.7 Normal -0.8 0.802 g/cm2 -1.5% - Left Forearm Radius 33% 07/01/2017 52.7 Normal -0.7 0.814 g/cm2 -5.0% Yes Left Forearm Radius 33% 06/25/2016 51.7 Normal -0.2 0.857 g/cm2 2.6% - Left Forearm Radius 33% 06/14/2015 50.7 Normal -0.5 0.835 g/cm2 - - ASSESSMENT: The BMD measured at Femur Neck Right is 0.645 g/cm2 with a T-score of -2.8. This patient is considered OSTEOPOROTIC according to [facility]) criteria. Compared with prior study, there has been a significant decrease in the spine. Compared with prior study, there has been no significant change in the total hip. The scan quality was limited. World Pharmacologist [facility]) criteria for post-menopausal, Caucasian Women: Normal:                   T-score at or above -1 SD Osteopenia/low bone mass: T-score between -1 and -2.5 SD Osteoporosis:             T-score at or below -2.5 SD RECOMMENDATIONS: 1. All patients should optimize calcium and vitamin D intake. 2. Consider FDA-approved medical therapies in postmenopausal women and men aged 46 years and older, based on the following: a. A hip or vertebral(clinical or morphometric) fracture b. T-score < -2.5 at the femoral neck or spine after appropriate evaluation to exclude secondary causes c. Low bone mass (T-score between -1.0 and -2.5 at the femoral neck or spine) and a 10-year probability of a hip fracture > 3% or a 10-year probability of a major osteoporosis-related fracture > 20% based on the [place]-adapted WHO algorithm 3. Clinician judgment and/or patient preferences may  indicate treatment for people with 10-year fracture probabilities above or below these levels FOLLOW-UP: People with diagnosed cases of osteoporosis or at high risk for fracture should have regular bone mineral density tests. For patients eligible for Medicare, routine testing is allowed once every 2 years. The testing frequency can be increased to one year for patients who have rapidly progressing disease, those who are receiving or discontinuing medical therapy to restore bone mass, or have additional risk factors. I have reviewed this report, and agree with the above findings. Mark A. Thornton Papas, M.D. [facility] Electronically Signed   By: Lavonia Dana M.D.   On: 07/12/2019 11:57    ASSESSMENT:  ER/PR positive, HER-2 negative stage IIa adenocarcinoma of the upper outer quadrant of the  left breast  PLAN:    1. 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 She completed her chemotherapy in approximately March 2016. Treatment was discontinued early secondary to worsening symptoms of her multiple sclerosis.  Patient was recently switched back to tamoxifen secondary to her osteoporosis and will complete 5 years of treatment in April 2021.  Patient has been instructed to complete her current prescription of tamoxifen and then discontinue.  Her most recent mammogram on September 30, 2018 was reported as BI-RADS 2.  Repeat in August 2021.  Return to clinic in 1 year with video assisted telemedicine visit.   2. Multiple sclerosis: Chronic and unchanged.  Continue 500 mg of Rituxan every 4 months as indicated. Continue close follow-up with [place] [facility]. 3. Osteoporosis: Patient's most recent bone mineral density on July 12, 2019 was 100 at -2.8.  This is significantly improved from 1 year prior where the T score was reported at 3.6.  Continue Fosamax, calcium, and vitamin D.  Repeat  in June 2022. 4.  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  Previously recommended continue Eliquis lifelong.  5. Port:  Continue port flush every 6 weeks.   Patient expressed understanding and was in agreement with this plan. She also understands that She can call clinic at any time with any questions, concerns, or complaints.    Breast CA [facility])   Staging form: Breast, AJCC 7th Edition     Clinical stage from 09/09/2014: Stage IIA (T1c, N1, M0) - Signed by [name], MD on 09/09/2014   [name], MD   07/15/2019 12:59 PM

## 2019-07-12 ENCOUNTER — Telehealth: Payer: Self-pay | Admitting: Oncology

## 2019-07-12 ENCOUNTER — Ambulatory Visit
Admission: RE | Admit: 2019-07-12 | Discharge: 2019-07-12 | Disposition: A | Discharge status: Home / Self Care | Payer: BC Managed Care – PPO | Source: Ambulatory Visit | Attending: Oncology | Admitting: Oncology

## 2019-07-12 DIAGNOSIS — C50412 Malignant neoplasm of upper-outer quadrant of left female breast: Secondary | ICD-10-CM | POA: Insufficient documentation

## 2019-07-12 NOTE — Telephone Encounter (Signed)
Patient's family member phoned on this date and asked to schedule an appt for patient to have her port flushed. Writer attempted to do this; however, patient was in an appt with someone and someone was in her chart, so writer could not schedule. Family member stated that patient has an appt on 07-15-19 with Dr. Grayland Ormond and would speak with him about getting scheduled for this at that time.

## 2019-07-15 ENCOUNTER — Encounter: Payer: Self-pay | Admitting: Oncology

## 2019-07-15 ENCOUNTER — Other Ambulatory Visit: Payer: Self-pay

## 2019-07-15 ENCOUNTER — Inpatient Hospital Stay: Payer: BC Managed Care – PPO | Attending: Oncology | Admitting: Oncology

## 2019-07-15 VITALS — BP 111/81 | HR 83 | Temp 97.5°F | Resp 20 | Wt 128.0 lb

## 2019-07-15 DIAGNOSIS — Z7901 Long term (current) use of anticoagulants: Secondary | ICD-10-CM | POA: Diagnosis not present

## 2019-07-15 DIAGNOSIS — Z79899 Other long term (current) drug therapy: Secondary | ICD-10-CM | POA: Diagnosis not present

## 2019-07-15 DIAGNOSIS — Z9221 Personal history of antineoplastic chemotherapy: Secondary | ICD-10-CM | POA: Insufficient documentation

## 2019-07-15 DIAGNOSIS — Z170 Estrogen receptor positive status [ER+]: Secondary | ICD-10-CM | POA: Insufficient documentation

## 2019-07-15 DIAGNOSIS — M810 Age-related osteoporosis without current pathological fracture: Secondary | ICD-10-CM | POA: Diagnosis not present

## 2019-07-15 DIAGNOSIS — G35 Multiple sclerosis: Secondary | ICD-10-CM | POA: Insufficient documentation

## 2019-07-15 DIAGNOSIS — R5383 Other fatigue: Secondary | ICD-10-CM | POA: Diagnosis not present

## 2019-07-15 DIAGNOSIS — Z923 Personal history of irradiation: Secondary | ICD-10-CM | POA: Insufficient documentation

## 2019-07-15 DIAGNOSIS — C50412 Malignant neoplasm of upper-outer quadrant of left female breast: Secondary | ICD-10-CM | POA: Insufficient documentation

## 2019-07-15 NOTE — Progress Notes (Signed)
Patient seen today for follow up. Patient denies pain or concerns at this time. Reports she is in need of a port flush. Port was last used in March and won't be needed to be used until July. States she was told she could not get an appointment because she had an appointment today with provider.

## 2019-07-19 ENCOUNTER — Inpatient Hospital Stay: Payer: BC Managed Care – PPO

## 2019-07-19 ENCOUNTER — Other Ambulatory Visit: Payer: Self-pay

## 2019-07-19 DIAGNOSIS — Z95828 Presence of other vascular implants and grafts: Secondary | ICD-10-CM

## 2019-07-19 DIAGNOSIS — C50412 Malignant neoplasm of upper-outer quadrant of left female breast: Secondary | ICD-10-CM | POA: Diagnosis not present

## 2019-07-19 MED ORDER — HEPARIN SOD (PORK) LOCK FLUSH 100 UNIT/ML IV SOLN
INTRAVENOUS | Status: AC
  Filled 2019-07-19: qty 5

## 2019-07-19 MED ORDER — SODIUM CHLORIDE 0.9% FLUSH
10.0000 mL | Freq: Once | INTRAVENOUS | Status: AC
  Administered 2019-07-19: 10 mL via INTRAVENOUS
  Filled 2019-07-19: qty 10

## 2019-07-19 MED ORDER — HEPARIN SOD (PORK) LOCK FLUSH 100 UNIT/ML IV SOLN
500.0000 [IU] | Freq: Once | INTRAVENOUS | Status: AC
  Administered 2019-07-19: 500 [IU] via INTRAVENOUS
  Filled 2019-07-19: qty 5

## 2019-09-12 ENCOUNTER — Inpatient Hospital Stay: Payer: BC Managed Care – PPO | Attending: Oncology

## 2019-09-16 ENCOUNTER — Inpatient Hospital Stay: Payer: BC Managed Care – PPO

## 2019-09-22 ENCOUNTER — Other Ambulatory Visit: Payer: Self-pay | Admitting: Oncology

## 2019-10-19 ENCOUNTER — Ambulatory Visit
Admission: RE | Admit: 2019-10-19 | Discharge: 2019-10-19 | Disposition: A | Discharge status: Home / Self Care | Payer: BC Managed Care – PPO | Source: Ambulatory Visit | Attending: Oncology | Admitting: Oncology

## 2019-10-19 ENCOUNTER — Other Ambulatory Visit: Payer: Self-pay

## 2019-10-19 DIAGNOSIS — C50412 Malignant neoplasm of upper-outer quadrant of left female breast: Secondary | ICD-10-CM

## 2019-10-19 DIAGNOSIS — Z1231 Encounter for screening mammogram for malignant neoplasm of breast: Secondary | ICD-10-CM | POA: Insufficient documentation

## 2019-11-04 ENCOUNTER — Inpatient Hospital Stay: Payer: BC Managed Care – PPO | Attending: Oncology

## 2019-11-04 ENCOUNTER — Other Ambulatory Visit: Payer: Self-pay

## 2019-11-04 DIAGNOSIS — Z170 Estrogen receptor positive status [ER+]: Secondary | ICD-10-CM | POA: Insufficient documentation

## 2019-11-04 DIAGNOSIS — Z452 Encounter for adjustment and management of vascular access device: Secondary | ICD-10-CM | POA: Diagnosis not present

## 2019-11-04 DIAGNOSIS — Z95828 Presence of other vascular implants and grafts: Secondary | ICD-10-CM

## 2019-11-04 DIAGNOSIS — C50412 Malignant neoplasm of upper-outer quadrant of left female breast: Secondary | ICD-10-CM | POA: Diagnosis present

## 2019-11-04 MED ORDER — SODIUM CHLORIDE 0.9% FLUSH
10.0000 mL | Freq: Once | INTRAVENOUS | Status: AC
  Administered 2019-11-04: 10 mL via INTRAVENOUS
  Filled 2019-11-04: qty 10

## 2019-11-04 MED ORDER — HEPARIN SOD (PORK) LOCK FLUSH 100 UNIT/ML IV SOLN
INTRAVENOUS | Status: AC
  Filled 2019-11-04: qty 5

## 2019-11-04 MED ORDER — HEPARIN SOD (PORK) LOCK FLUSH 100 UNIT/ML IV SOLN
500.0000 [IU] | Freq: Once | INTRAVENOUS | Status: AC
  Administered 2019-11-04: 500 [IU] via INTRAVENOUS
  Filled 2019-11-04: qty 5

## 2019-12-23 ENCOUNTER — Inpatient Hospital Stay: Payer: BC Managed Care – PPO

## 2020-03-30 ENCOUNTER — Inpatient Hospital Stay: Payer: BC Managed Care – PPO

## 2020-04-03 ENCOUNTER — Other Ambulatory Visit: Payer: Self-pay

## 2020-04-03 ENCOUNTER — Inpatient Hospital Stay: Payer: BC Managed Care – PPO | Attending: Oncology

## 2020-04-03 DIAGNOSIS — C50412 Malignant neoplasm of upper-outer quadrant of left female breast: Secondary | ICD-10-CM | POA: Diagnosis not present

## 2020-04-03 DIAGNOSIS — Z452 Encounter for adjustment and management of vascular access device: Secondary | ICD-10-CM | POA: Diagnosis not present

## 2020-04-03 DIAGNOSIS — Z95828 Presence of other vascular implants and grafts: Secondary | ICD-10-CM

## 2020-04-03 MED ORDER — HEPARIN SOD (PORK) LOCK FLUSH 100 UNIT/ML IV SOLN
INTRAVENOUS | Status: AC
  Filled 2020-04-03: qty 5

## 2020-04-03 MED ORDER — SODIUM CHLORIDE 0.9% FLUSH
10.0000 mL | Freq: Once | INTRAVENOUS | Status: AC
  Administered 2020-04-03: 10 mL via INTRAVENOUS
  Filled 2020-04-03: qty 10

## 2020-04-03 MED ORDER — HEPARIN SOD (PORK) LOCK FLUSH 100 UNIT/ML IV SOLN
500.0000 [IU] | Freq: Once | INTRAVENOUS | Status: AC
  Administered 2020-04-03: 500 [IU] via INTRAVENOUS
  Filled 2020-04-03: qty 5

## 2020-05-18 ENCOUNTER — Inpatient Hospital Stay: Payer: BC Managed Care – PPO

## 2020-07-12 ENCOUNTER — Ambulatory Visit
Admission: RE | Admit: 2020-07-12 | Discharge: 2020-07-12 | Disposition: A | Discharge status: Home / Self Care | Payer: BC Managed Care – PPO | Source: Ambulatory Visit | Attending: Oncology | Admitting: Oncology

## 2020-07-12 ENCOUNTER — Other Ambulatory Visit: Payer: Self-pay

## 2020-07-12 DIAGNOSIS — C50412 Malignant neoplasm of upper-outer quadrant of left female breast: Secondary | ICD-10-CM | POA: Insufficient documentation

## 2020-07-13 NOTE — Progress Notes (Signed)
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  INTERVAL HISTORY: Patient returns to clinic today for routine yearly evaluation.  She continues to receive Rituxan infusions for her MS at [facility] every 4 months.  She no longer takes tamoxifen, but continues with Fosamax, calcium, and vitamin D.  She continues have chronic weakness and fatigue.  She has no new neurologic complaints related to her multiple sclerosis.  She denies any recent fevers or illnesses.  She denies any chest pain, shortness of breath, cough, or hemoptysis.  She has a good appetite and denies weight loss. She denies any nausea, vomiting, constipation, or diarrhea. She has no urinary complaints.  Patient offers no further specific complaints today.  REVIEW OF SYSTEMS:   Review of Systems  Constitutional: Positive for malaise/fatigue. Negative for fever and weight loss.  Respiratory: Negative.  Negative for cough and shortness of breath.   Cardiovascular: Negative.  Negative for chest pain and leg swelling.  Gastrointestinal: Negative.  Negative for abdominal pain and constipation.  Genitourinary: Negative.  Negative for dysuria.  Musculoskeletal: Negative.  Negative for back pain.  Skin: Negative.  Negative for rash.  Neurological: Positive for tremors, sensory change, focal weakness and weakness.  Psychiatric/Behavioral: Negative.  Negative for depression. The patient is not nervous/anxious.     As per HPI. Otherwise, a complete review of systems is negative.  PAST MEDICAL HISTORY: Past Medical History:  Diagnosis Date  . Breast cancer ([place]) 2015    left breast cancer  . Hypertension   . MS (multiple sclerosis) ([place])    unable to stand on her own  . Personal history of chemotherapy   . Personal history of radiation therapy   . Post-menopausal   . Presence of implanted infusion pump    right abdomen  . Status post chemotherapy    left breast cancer  . Status post radiation therapy    breast cancer    PAST SURGICAL HISTORY: Past Surgical History:  Procedure Laterality Date  . BREAST BIOPSY Left    positive 2015  . BREAST LUMPECTOMY Left 2015   invasive, f/u radiation.   . COLONOSCOPY WITH PROPOFOL N/A 03/24/2018   Procedure: COLONOSCOPY WITH PROPOFOL;  Surgeon: Toledo, Benay Pike, MD;  Location: [facility] ENDOSCOPY;  Service: Gastroenterology;  Laterality: N/A;  . ESOPHAGOGASTRODUODENOSCOPY (EGD) WITH PROPOFOL N/A 03/24/2018   Procedure: ESOPHAGOGASTRODUODENOSCOPY (EGD) WITH PROPOFOL;  Surgeon: Toledo, Benay Pike, MD;  Location: [facility] ENDOSCOPY;  Service: Gastroenterology;  Laterality: N/A;    FAMILY HISTORY Family History  Problem Relation Age of Onset  . Breast cancer Neg Hx     GYNECOLOGIC HISTORY:  Patient's last menstrual period was 03/25/2011.     ADVANCED DIRECTIVES:    HEALTH MAINTENANCE: Social History   Tobacco Use  . Smoking status: Never Smoker  . Smokeless tobacco: Never Used  Substance Use Topics  . Alcohol use: No  . Drug use: No     Colonoscopy:  PAP:  Bone density:  Lipid panel:  No Known Allergies  Current Outpatient Medications  Medication Sig Dispense Refill  . acetaminophen (TYLENOL) 325 MG tablet Take by mouth.    Marland Kitchen alendronate (FOSAMAX) 70 MG  tablet TAKE ONE TABLET EVERY WEEK. TAKE WITH A FULL GLASS OF WATER ON AN EMPTY STOMACH. 12 tablet 0  . amantadine (SYMMETREL) 100 MG capsule Take 100 mg by mouth daily.     Marland Kitchen amLODipine (NORVASC) 5 MG tablet     . baclofen (LIORESAL) 10 MG tablet Take 10 mg by mouth.     . Calcium Carbonate-Vitamin D 600-400 MG-UNIT per tablet Take 1 tablet by  mouth 2 (two) times daily.     . clonazePAM (KLONOPIN) 0.5 MG tablet     . FLUoxetine (PROZAC) 20 MG capsule Take 20 mg by mouth daily.     Marland Kitchen omeprazole (PRILOSEC) 20 MG capsule Take 20 mg by mouth daily.     Marland Kitchen oxybutynin (DITROPAN) 5 MG tablet Take 5 mg by mouth 2 (two) times daily.     . rivaroxaban (XARELTO) 20 MG TABS tablet Take 20 mg by mouth daily with supper.     No current facility-administered medications for this visit.   Facility-Administered Medications Ordered in Other Visits  Medication Dose Route Frequency Provider Last Rate Last Admin  . sodium chloride 0.9 % injection 10 mL  10 mL Intravenous PRN Choksi, Janak, MD   10 mL at 06/23/14 1106    OBJECTIVE: BP 120/90   Pulse 85   Temp (!) 96.9 F (36.1 C) (Tympanic)   Resp 20   LMP 03/25/2011    There is no height or weight on file to calculate BMI.    ECOG FS:2 - Symptomatic, <50% confined to bed  General: Well-developed, well-nourished, no acute distress.  Sitting in wheelchair. Eyes: Pink conjunctiva, anicteric sclera. HEENT: Normocephalic, moist mucous membranes. Lungs: No audible wheezing or coughing. Heart: Regular rate and rhythm. Abdomen: Soft, nontender, no obvious distention. Musculoskeletal: No edema, cyanosis, or clubbing. Neuro: Alert, answering all questions appropriately. Cranial nerves grossly intact. Skin: No rashes or petechiae noted. Psych: Normal affect.   LAB RESULTS:  No visits with results within 3 Day(s) from this visit.  Latest known visit with results is:  Appointment on 12/17/2017  Component Date Value Ref Range Status  . CA 27.29 12/17/2017 23.2  0.0 - 38.6 U/mL Final   Comment: (NOTE) Siemens Centaur Immunochemiluminometric Methodology [facility]) Values obtained with different assay methods or kits cannot be used interchangeably. Results cannot be interpreted as absolute evidence of the presence or absence of malignant disease. Performed At: [facility] [place], [place] [id number] Rush Farmer MD [id number]     STUDIES: No results found.  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 She completed her chemotherapy in approximately March 2016. Treatment was discontinued early secondary to worsening symptoms of her multiple sclerosis.  Patient completed 5 years of tamoxifen in April 2021.  Her most recent mammogram on October 19, 2019 was reported as BI-RADS 2.  Repeat in September 2022.  No further intervention is needed.  Return to clinic in 1 year for further evaluation.   2. Multiple sclerosis: Chronic and unchanged.  Continue 500 mg of Rituxan every 4 months as indicated. Continue close follow-up with [place] [facility].  Patient will require port flushes every 6 to 8 weeks.  3. Osteoporosis: Patient's most recent bone mineral density on July 12, 2020 reported T score  of -3.5.  Although this is significantly worse than 1 year prior where the T score was reported -2.8, her T score 2 years ago was -3.6.  Continue Fosamax, calcium, and vitamin D.  Repeat bone mineral density in June 2023.  Will defer to primary care for follow-up.  4.  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  Previously recommended continue Eliquis lifelong.  5. Port:  Continue port flush every 6 weeks.   Patient expressed understanding and was in agreement with this plan. She also understands that She can call clinic at any time with any questions, concerns, or complaints.    Breast CA [facility])   Staging form: Breast, AJCC 7th Edition     Clinical stage from 09/09/2014: Stage IIA (T1c, N1, M0) - Signed by [name], MD on  09/09/2014   [name], MD   07/18/2020 2:10 PM

## 2020-07-16 ENCOUNTER — Ambulatory Visit: Payer: BC Managed Care – PPO | Admitting: Oncology

## 2020-07-17 ENCOUNTER — Other Ambulatory Visit: Payer: Self-pay | Admitting: *Deleted

## 2020-07-17 DIAGNOSIS — C50412 Malignant neoplasm of upper-outer quadrant of left female breast: Secondary | ICD-10-CM

## 2020-07-18 ENCOUNTER — Other Ambulatory Visit: Payer: Self-pay

## 2020-07-18 ENCOUNTER — Encounter: Payer: Self-pay | Admitting: Oncology

## 2020-07-18 ENCOUNTER — Inpatient Hospital Stay (HOSPITAL_BASED_OUTPATIENT_CLINIC_OR_DEPARTMENT_OTHER): Payer: BC Managed Care – PPO | Admitting: Oncology

## 2020-07-18 ENCOUNTER — Inpatient Hospital Stay: Payer: BC Managed Care – PPO | Attending: Oncology

## 2020-07-18 VITALS — BP 120/90 | HR 85 | Temp 96.9°F | Resp 20

## 2020-07-18 DIAGNOSIS — M810 Age-related osteoporosis without current pathological fracture: Secondary | ICD-10-CM | POA: Diagnosis not present

## 2020-07-18 DIAGNOSIS — Z923 Personal history of irradiation: Secondary | ICD-10-CM | POA: Diagnosis not present

## 2020-07-18 DIAGNOSIS — G35 Multiple sclerosis: Secondary | ICD-10-CM | POA: Diagnosis not present

## 2020-07-18 DIAGNOSIS — C50412 Malignant neoplasm of upper-outer quadrant of left female breast: Secondary | ICD-10-CM | POA: Diagnosis not present

## 2020-07-18 DIAGNOSIS — Z86718 Personal history of other venous thrombosis and embolism: Secondary | ICD-10-CM | POA: Diagnosis not present

## 2020-07-18 DIAGNOSIS — Z79899 Other long term (current) drug therapy: Secondary | ICD-10-CM | POA: Insufficient documentation

## 2020-07-18 DIAGNOSIS — Z9221 Personal history of antineoplastic chemotherapy: Secondary | ICD-10-CM | POA: Insufficient documentation

## 2020-07-18 DIAGNOSIS — R251 Tremor, unspecified: Secondary | ICD-10-CM | POA: Insufficient documentation

## 2020-07-18 DIAGNOSIS — Z7901 Long term (current) use of anticoagulants: Secondary | ICD-10-CM | POA: Insufficient documentation

## 2020-07-18 DIAGNOSIS — Z170 Estrogen receptor positive status [ER+]: Secondary | ICD-10-CM | POA: Insufficient documentation

## 2020-07-18 DIAGNOSIS — R5383 Other fatigue: Secondary | ICD-10-CM | POA: Insufficient documentation

## 2020-07-18 MED ORDER — SODIUM CHLORIDE 0.9% FLUSH
10.0000 mL | Freq: Once | INTRAVENOUS | Status: AC
  Administered 2020-07-18: 10 mL via INTRAVENOUS
  Filled 2020-07-18: qty 10

## 2020-07-18 MED ORDER — HEPARIN SOD (PORK) LOCK FLUSH 100 UNIT/ML IV SOLN
500.0000 [IU] | Freq: Once | INTRAVENOUS | Status: AC
  Administered 2020-07-18: 500 [IU] via INTRAVENOUS
  Filled 2020-07-18: qty 5

## 2020-07-18 MED ORDER — HEPARIN SOD (PORK) LOCK FLUSH 100 UNIT/ML IV SOLN
INTRAVENOUS | Status: AC
  Filled 2020-07-18: qty 5

## 2020-07-18 NOTE — Progress Notes (Signed)
Patient denies any concerns today.  

## 2020-07-19 LAB — CANCER ANTIGEN 27.29: CA 27.29: 24 U/mL (ref 0.0–38.6)

## 2020-09-18 ENCOUNTER — Inpatient Hospital Stay: Payer: BC Managed Care – PPO

## 2020-10-19 ENCOUNTER — Other Ambulatory Visit: Payer: Self-pay | Admitting: Oncology

## 2020-10-19 DIAGNOSIS — Z1231 Encounter for screening mammogram for malignant neoplasm of breast: Secondary | ICD-10-CM

## 2020-11-06 ENCOUNTER — Other Ambulatory Visit: Payer: Self-pay

## 2020-11-06 ENCOUNTER — Ambulatory Visit
Admission: RE | Admit: 2020-11-06 | Discharge: 2020-11-06 | Disposition: A | Discharge status: Home / Self Care | Payer: BC Managed Care – PPO | Source: Ambulatory Visit | Attending: Oncology | Admitting: Oncology

## 2020-11-06 DIAGNOSIS — Z1231 Encounter for screening mammogram for malignant neoplasm of breast: Secondary | ICD-10-CM | POA: Insufficient documentation

## 2020-11-16 ENCOUNTER — Other Ambulatory Visit: Payer: Self-pay

## 2020-11-16 DIAGNOSIS — C50412 Malignant neoplasm of upper-outer quadrant of left female breast: Secondary | ICD-10-CM

## 2020-11-19 ENCOUNTER — Inpatient Hospital Stay: Payer: BC Managed Care – PPO | Attending: Oncology

## 2020-11-19 ENCOUNTER — Other Ambulatory Visit: Payer: Self-pay

## 2020-11-19 DIAGNOSIS — M810 Age-related osteoporosis without current pathological fracture: Secondary | ICD-10-CM | POA: Insufficient documentation

## 2020-11-19 DIAGNOSIS — Z95828 Presence of other vascular implants and grafts: Secondary | ICD-10-CM

## 2020-11-19 DIAGNOSIS — Z170 Estrogen receptor positive status [ER+]: Secondary | ICD-10-CM | POA: Insufficient documentation

## 2020-11-19 DIAGNOSIS — G35 Multiple sclerosis: Secondary | ICD-10-CM | POA: Diagnosis not present

## 2020-11-19 DIAGNOSIS — C50412 Malignant neoplasm of upper-outer quadrant of left female breast: Secondary | ICD-10-CM | POA: Insufficient documentation

## 2020-11-19 DIAGNOSIS — Z452 Encounter for adjustment and management of vascular access device: Secondary | ICD-10-CM | POA: Diagnosis not present

## 2020-11-19 DIAGNOSIS — Z79899 Other long term (current) drug therapy: Secondary | ICD-10-CM | POA: Diagnosis not present

## 2020-11-19 MED ORDER — SODIUM CHLORIDE 0.9% FLUSH
10.0000 mL | INTRAVENOUS | Status: DC | PRN
  Administered 2020-11-19: 10 mL via INTRAVENOUS
  Filled 2020-11-19: qty 10

## 2020-11-19 MED ORDER — HEPARIN SOD (PORK) LOCK FLUSH 100 UNIT/ML IV SOLN
INTRAVENOUS | Status: AC
  Administered 2020-11-19: 500 [IU] via INTRAVENOUS
  Filled 2020-11-19: qty 5

## 2020-11-19 MED ORDER — HEPARIN SOD (PORK) LOCK FLUSH 100 UNIT/ML IV SOLN
500.0000 [IU] | Freq: Once | INTRAVENOUS | Status: AC
  Filled 2020-11-19: qty 5

## 2021-01-21 ENCOUNTER — Other Ambulatory Visit: Payer: Self-pay

## 2021-01-21 ENCOUNTER — Inpatient Hospital Stay: Payer: BC Managed Care – PPO | Attending: Oncology

## 2021-01-21 DIAGNOSIS — C50412 Malignant neoplasm of upper-outer quadrant of left female breast: Secondary | ICD-10-CM | POA: Insufficient documentation

## 2021-01-21 DIAGNOSIS — Z452 Encounter for adjustment and management of vascular access device: Secondary | ICD-10-CM | POA: Diagnosis not present

## 2021-01-21 DIAGNOSIS — Z95828 Presence of other vascular implants and grafts: Secondary | ICD-10-CM

## 2021-01-21 MED ORDER — SODIUM CHLORIDE 0.9% FLUSH
10.0000 mL | Freq: Once | INTRAVENOUS | Status: AC
  Administered 2021-01-21: 10 mL via INTRAVENOUS
  Filled 2021-01-21: qty 10

## 2021-01-21 MED ORDER — HEPARIN SOD (PORK) LOCK FLUSH 100 UNIT/ML IV SOLN
500.0000 [IU] | Freq: Once | INTRAVENOUS | Status: AC
  Administered 2021-01-21: 500 [IU] via INTRAVENOUS
  Filled 2021-01-21: qty 5

## 2021-03-25 ENCOUNTER — Inpatient Hospital Stay: Payer: BC Managed Care – PPO

## 2021-05-20 ENCOUNTER — Inpatient Hospital Stay: Payer: BC Managed Care – PPO

## 2021-06-26 ENCOUNTER — Inpatient Hospital Stay: Payer: BC Managed Care – PPO | Attending: Oncology

## 2021-06-26 DIAGNOSIS — Z452 Encounter for adjustment and management of vascular access device: Secondary | ICD-10-CM | POA: Insufficient documentation

## 2021-06-26 DIAGNOSIS — C50412 Malignant neoplasm of upper-outer quadrant of left female breast: Secondary | ICD-10-CM | POA: Diagnosis present

## 2021-06-26 DIAGNOSIS — Z95828 Presence of other vascular implants and grafts: Secondary | ICD-10-CM

## 2021-06-26 MED ORDER — HEPARIN SOD (PORK) LOCK FLUSH 100 UNIT/ML IV SOLN
500.0000 [IU] | Freq: Once | INTRAVENOUS | Status: AC
  Administered 2021-06-26: 500 [IU] via INTRAVENOUS
  Filled 2021-06-26: qty 5

## 2021-06-26 MED ORDER — SODIUM CHLORIDE 0.9% FLUSH
10.0000 mL | Freq: Once | INTRAVENOUS | Status: AC
  Administered 2021-06-26: 10 mL via INTRAVENOUS
  Filled 2021-06-26: qty 10

## 2021-07-18 ENCOUNTER — Inpatient Hospital Stay: Payer: BC Managed Care – PPO | Attending: Nurse Practitioner

## 2021-07-18 ENCOUNTER — Other Ambulatory Visit: Payer: Self-pay

## 2021-07-18 ENCOUNTER — Encounter: Payer: Self-pay | Admitting: Nurse Practitioner

## 2021-07-18 ENCOUNTER — Inpatient Hospital Stay (HOSPITAL_BASED_OUTPATIENT_CLINIC_OR_DEPARTMENT_OTHER): Payer: BC Managed Care – PPO | Admitting: Nurse Practitioner

## 2021-07-18 VITALS — BP 119/73 | HR 79 | Temp 97.4°F | Resp 16 | Wt 127.0 lb

## 2021-07-18 DIAGNOSIS — C50412 Malignant neoplasm of upper-outer quadrant of left female breast: Secondary | ICD-10-CM

## 2021-07-18 DIAGNOSIS — Z9221 Personal history of antineoplastic chemotherapy: Secondary | ICD-10-CM | POA: Insufficient documentation

## 2021-07-18 DIAGNOSIS — G35 Multiple sclerosis: Secondary | ICD-10-CM | POA: Diagnosis not present

## 2021-07-18 DIAGNOSIS — Z923 Personal history of irradiation: Secondary | ICD-10-CM | POA: Diagnosis not present

## 2021-07-18 DIAGNOSIS — Z170 Estrogen receptor positive status [ER+]: Secondary | ICD-10-CM | POA: Diagnosis not present

## 2021-07-18 DIAGNOSIS — Z780 Asymptomatic menopausal state: Secondary | ICD-10-CM | POA: Diagnosis not present

## 2021-07-18 DIAGNOSIS — M810 Age-related osteoporosis without current pathological fracture: Secondary | ICD-10-CM

## 2021-07-18 DIAGNOSIS — R5383 Other fatigue: Secondary | ICD-10-CM | POA: Insufficient documentation

## 2021-07-18 DIAGNOSIS — Z79899 Other long term (current) drug therapy: Secondary | ICD-10-CM | POA: Diagnosis not present

## 2021-07-18 DIAGNOSIS — Z7901 Long term (current) use of anticoagulants: Secondary | ICD-10-CM | POA: Diagnosis not present

## 2021-07-18 DIAGNOSIS — Z803 Family history of malignant neoplasm of breast: Secondary | ICD-10-CM | POA: Diagnosis not present

## 2021-07-18 NOTE — Addendum Note (Signed)
Addended by: Luella Cook on: 07/18/2021 03:09 PM   Modules accepted: Orders

## 2021-07-18 NOTE — Progress Notes (Signed)
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  INTERVAL HISTORY: Patient returns to clinic today for routine yearly evaluation.  She continues to receive Rituxan infusions for her MS at [facility]. Some recent discussion of starting IVIG. Continues fosamax, calcium and vitamin D. She has completed tamoxifen. She walks very infrequently and primarily uses a wheelchair. She continues have chronic weakness and fatigue.  She has no new neurologic complaints related to her multiple sclerosis.  She denies any recent fevers or illnesses.  She denies any chest pain, shortness of breath, cough, or hemoptysis.  She has a good appetite and denies weight loss. She denies any nausea, vomiting, constipation, or diarrhea. She has no urinary complaints.  Patient offers no further specific complaints today.  REVIEW OF SYSTEMS:   Review of Systems  Constitutional:  Positive for malaise/fatigue. Negative for fever and weight loss.  Respiratory: Negative.  Negative for cough and shortness of breath.   Cardiovascular: Negative.  Negative for chest pain and leg swelling.  Gastrointestinal: Negative.  Negative for abdominal pain and constipation.  Genitourinary: Negative.  Negative for dysuria.  Musculoskeletal: Negative.  Negative for back pain.  Skin: Negative.  Negative for rash.  Neurological:  Positive for tremors, sensory change, focal weakness and weakness.  Psychiatric/Behavioral: Negative.  Negative for depression. The patient is not nervous/anxious.   As per HPI. Otherwise, a complete review of systems is negative.  PAST MEDICAL HISTORY: Past  Medical History:  Diagnosis Date   Breast cancer ([place]) 2015   left breast cancer   Hypertension    MS (multiple sclerosis) ([place])    unable to stand on her own   Personal history of chemotherapy    Personal history of radiation therapy    Post-menopausal    Presence of implanted infusion pump    right abdomen   Status post chemotherapy    left breast cancer   Status post radiation therapy    breast cancer    PAST SURGICAL HISTORY: Past Surgical History:  Procedure Laterality Date   BREAST BIOPSY Left    positive 2015   BREAST LUMPECTOMY Left 2015   invasive, f/u radiation.    COLONOSCOPY WITH PROPOFOL N/A 03/24/2018   Procedure: COLONOSCOPY WITH PROPOFOL;  Surgeon: Toledo, Benay Pike, MD;  Location: [facility] ENDOSCOPY;  Service: Gastroenterology;  Laterality: N/A;   ESOPHAGOGASTRODUODENOSCOPY (EGD) WITH PROPOFOL N/A 03/24/2018   Procedure: ESOPHAGOGASTRODUODENOSCOPY (EGD) WITH PROPOFOL;  Surgeon: Toledo, Benay Pike, MD;  Location: [facility] ENDOSCOPY;  Service: Gastroenterology;  Laterality: N/A;    FAMILY HISTORY Family History  Problem Relation Age of Onset   Breast cancer Neg Hx     GYNECOLOGIC HISTORY:  Patient's last menstrual period was 03/25/2011.     ADVANCED DIRECTIVES:    HEALTH MAINTENANCE: Social History   Tobacco Use   Smoking status: Never   Smokeless tobacco: Never  Substance Use Topics   Alcohol use: No   Drug use: No     Colonoscopy:  PAP:  Bone density:  Lipid panel:  No Known Allergies  Current Outpatient Medications  Medication Sig Dispense Refill   acetaminophen (TYLENOL) 325 MG tablet Take by mouth.     alendronate (  FOSAMAX) 70 MG tablet TAKE ONE TABLET EVERY WEEK. TAKE WITH A FULL GLASS OF WATER ON AN EMPTY STOMACH. 12 tablet 0   amantadine (SYMMETREL) 100 MG capsule Take 100 mg by mouth daily.      amLODipine (NORVASC) 5 MG tablet      baclofen (LIORESAL) 10 MG tablet Take 10 mg by mouth.      Calcium Carbonate-Vitamin D 600-400 MG-UNIT  per tablet Take 1 tablet by mouth 2 (two) times daily.      clonazePAM (KLONOPIN) 0.5 MG tablet      FLUoxetine (PROZAC) 20 MG capsule Take 20 mg by mouth daily.      oxybutynin (DITROPAN) 5 MG tablet Take 5 mg by mouth 2 (two) times daily.      pantoprazole (PROTONIX) 40 MG tablet Take 40 mg by mouth daily.     rivaroxaban (XARELTO) 20 MG TABS tablet Take 20 mg by mouth daily with supper.     omeprazole (PRILOSEC) 20 MG capsule Take 20 mg by mouth daily.  (Patient not taking: Reported on 07/18/2021)     No current facility-administered medications for this visit.   Facility-Administered Medications Ordered in Other Visits  Medication Dose Route Frequency Provider Last Rate Last Admin   sodium chloride 0.9 % injection 10 mL  10 mL Intravenous PRN Choksi, Delorise Shiner, MD   10 mL at 06/23/14 1106    OBJECTIVE: BP 119/73   Pulse 79   Temp (!) 97.4 F (36.3 C) (Tympanic)   Resp 16   Wt 127 lb (57.6 kg)   LMP 03/25/2011   BMI 23.23 kg/m    Body mass index is 23.23 kg/m.    ECOG FS:3 - Symptomatic, >50% confined to bed  General: Well-developed, well-nourished, no acute distress.  Sitting in wheelchair. Accompanied by mother.  Eyes: Pink conjunctiva, anicteric sclera. HEENT: Normocephalic, moist mucous membranes. Lungs: No audible wheezing or coughing. Heart: Regular rate and rhythm. Abdomen: Soft, nontender, no obvious distention. Musculoskeletal: deformity. Left foot drop.  Neuro: Alert, answering all questions appropriately. Skin: No rashes or petechiae noted. Psych: Normal affect.   LAB RESULTS:  No visits with results within 3 Day(s) from this visit.  Latest known visit with results is:  Infusion on 07/18/2020  Component Date Value Ref Range Status   CA 27.29 07/18/2020 24.0  0.0 - 38.6 U/mL Final   Comment: (NOTE) Siemens Centaur Immunochemiluminometric Methodology (ICMA) Values obtained with different assay methods or kits cannot be used interchangeably. Results cannot be  interpreted as absolute evidence of the presence or absence of malignant disease. Performed At: [facility] [place], [place] [id number] Rush Farmer MD [id number]     STUDIES: No results found.  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 She completed her chemotherapy in approximately March 2016. Treatment was discontinued early secondary to worsening symptoms of her multiple sclerosis.  Patient completed 5 years of tamoxifen in April 2021.  Her most recent mammogram on November 13, 2020 was reported as BI-RADS 1.  Repeat in October 2023. Ordered today. No further intervention is needed.  Return to clinic in 1 year for further evaluation. Could consider releasing surveillance to Dr. Ouida Sills. For now, patient wishes to continue surveillance at [facility].   2.  Multiple sclerosis: Chronic and unchanged.  Continue 500 mg of Rituxan every 4 months as indicated. Continue close follow-up with [place] [facility].  Patient will require port flushes every 6 to 8 weeks.   3. Osteoporosis: Patient's most recent bone mineral density on July 12, 2020 reported T score of -3.5.  Although this is significantly worse than 1 year prior where the T score was reported -2.8, her T score 2 years ago was -3.6.  Continue Fosamax, calcium, and vitamin D. Fosamax is prescribed by PCP. Will repeat bone density scan today.   4.  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  Previously recommended continue Xarelto lifelong. This is managed by Dr. Ouida Sills  5. Port:  Continue port flush every 8 weeks.  Disposition: Bone density October- mammogram Port flushes  every 8 weeks Rtc in 1 year for surveillance- [place]  Patient expressed understanding and was in agreement with this plan. She also understands that She can call clinic at any time with any questions, concerns, or complaints.   Breast CA [facility]) Staging form: Breast, AJCC 7th Edition     Clinical stage from 09/09/2014: Stage IIA (T1c, N1, M0) - Signed by [name], MD on 09/09/2014   Verlon Au, NP   07/18/2021

## 2021-07-18 NOTE — Progress Notes (Signed)
Annual follow-up for breast cancer. Denies any concerns.

## 2021-07-19 LAB — CANCER ANTIGEN 27.29: CA 27.29: 35.4 U/mL (ref 0.0–38.6)

## 2021-07-22 ENCOUNTER — Other Ambulatory Visit: Payer: Self-pay | Admitting: *Deleted

## 2021-07-22 DIAGNOSIS — Z780 Asymptomatic menopausal state: Secondary | ICD-10-CM

## 2021-07-22 DIAGNOSIS — Z1231 Encounter for screening mammogram for malignant neoplasm of breast: Secondary | ICD-10-CM

## 2021-07-22 DIAGNOSIS — C50412 Malignant neoplasm of upper-outer quadrant of left female breast: Secondary | ICD-10-CM

## 2021-07-22 DIAGNOSIS — M810 Age-related osteoporosis without current pathological fracture: Secondary | ICD-10-CM

## 2021-07-22 NOTE — Progress Notes (Signed)
b

## 2021-09-10 ENCOUNTER — Ambulatory Visit
Admission: RE | Admit: 2021-09-10 | Discharge: 2021-09-10 | Disposition: A | Discharge status: Home / Self Care | Payer: BC Managed Care – PPO | Source: Ambulatory Visit | Attending: Nurse Practitioner | Admitting: Nurse Practitioner

## 2021-09-10 DIAGNOSIS — M810 Age-related osteoporosis without current pathological fracture: Secondary | ICD-10-CM | POA: Diagnosis present

## 2021-09-10 DIAGNOSIS — Z780 Asymptomatic menopausal state: Secondary | ICD-10-CM | POA: Diagnosis present

## 2021-09-10 DIAGNOSIS — C50412 Malignant neoplasm of upper-outer quadrant of left female breast: Secondary | ICD-10-CM | POA: Diagnosis not present

## 2021-09-17 ENCOUNTER — Other Ambulatory Visit: Payer: BC Managed Care – PPO

## 2021-09-18 ENCOUNTER — Telehealth: Payer: Self-pay | Admitting: Nurse Practitioner

## 2021-09-18 NOTE — Telephone Encounter (Signed)
pt has port flushed in july at [place], appts to be adjusted

## 2021-09-23 ENCOUNTER — Inpatient Hospital Stay: Payer: BC Managed Care – PPO

## 2021-10-15 ENCOUNTER — Inpatient Hospital Stay: Payer: BC Managed Care – PPO | Attending: Nurse Practitioner

## 2021-10-15 DIAGNOSIS — C50412 Malignant neoplasm of upper-outer quadrant of left female breast: Secondary | ICD-10-CM | POA: Diagnosis present

## 2021-10-15 DIAGNOSIS — Z95828 Presence of other vascular implants and grafts: Secondary | ICD-10-CM

## 2021-10-15 DIAGNOSIS — Z452 Encounter for adjustment and management of vascular access device: Secondary | ICD-10-CM | POA: Diagnosis not present

## 2021-10-15 DIAGNOSIS — Z780 Asymptomatic menopausal state: Secondary | ICD-10-CM

## 2021-10-15 MED ORDER — SODIUM CHLORIDE 0.9% FLUSH
10.0000 mL | Freq: Once | INTRAVENOUS | Status: AC
  Administered 2021-10-15: 10 mL via INTRAVENOUS
  Filled 2021-10-15: qty 10

## 2021-10-15 MED ORDER — HEPARIN SOD (PORK) LOCK FLUSH 100 UNIT/ML IV SOLN
500.0000 [IU] | Freq: Once | INTRAVENOUS | Status: AC
  Administered 2021-10-15: 500 [IU] via INTRAVENOUS
  Filled 2021-10-15: qty 5

## 2021-11-07 ENCOUNTER — Ambulatory Visit
Admission: RE | Admit: 2021-11-07 | Discharge: 2021-11-07 | Disposition: A | Discharge status: Home / Self Care | Payer: BC Managed Care – PPO | Source: Ambulatory Visit | Attending: Nurse Practitioner | Admitting: Nurse Practitioner

## 2021-11-07 DIAGNOSIS — M810 Age-related osteoporosis without current pathological fracture: Secondary | ICD-10-CM | POA: Diagnosis not present

## 2021-11-07 DIAGNOSIS — Z780 Asymptomatic menopausal state: Secondary | ICD-10-CM | POA: Diagnosis not present

## 2021-11-07 DIAGNOSIS — C50412 Malignant neoplasm of upper-outer quadrant of left female breast: Secondary | ICD-10-CM

## 2021-11-07 DIAGNOSIS — Z853 Personal history of malignant neoplasm of breast: Secondary | ICD-10-CM | POA: Insufficient documentation

## 2021-11-07 DIAGNOSIS — Z1231 Encounter for screening mammogram for malignant neoplasm of breast: Secondary | ICD-10-CM | POA: Diagnosis not present

## 2021-11-18 ENCOUNTER — Inpatient Hospital Stay: Payer: BC Managed Care – PPO

## 2021-12-10 ENCOUNTER — Inpatient Hospital Stay: Payer: BC Managed Care – PPO | Attending: Nurse Practitioner

## 2021-12-10 DIAGNOSIS — Z452 Encounter for adjustment and management of vascular access device: Secondary | ICD-10-CM | POA: Insufficient documentation

## 2021-12-10 DIAGNOSIS — C50412 Malignant neoplasm of upper-outer quadrant of left female breast: Secondary | ICD-10-CM | POA: Diagnosis present

## 2021-12-10 DIAGNOSIS — Z95828 Presence of other vascular implants and grafts: Secondary | ICD-10-CM

## 2021-12-10 MED ORDER — HEPARIN SOD (PORK) LOCK FLUSH 100 UNIT/ML IV SOLN
500.0000 [IU] | Freq: Once | INTRAVENOUS | Status: AC
  Administered 2021-12-10: 500 [IU] via INTRAVENOUS
  Filled 2021-12-10: qty 5

## 2021-12-10 MED ORDER — SODIUM CHLORIDE 0.9% FLUSH
10.0000 mL | Freq: Once | INTRAVENOUS | Status: AC
  Administered 2021-12-10: 10 mL via INTRAVENOUS
  Filled 2021-12-10: qty 10

## 2022-01-17 ENCOUNTER — Inpatient Hospital Stay: Payer: BC Managed Care – PPO

## 2022-02-07 ENCOUNTER — Inpatient Hospital Stay: Payer: BC Managed Care – PPO

## 2022-02-11 ENCOUNTER — Inpatient Hospital Stay: Payer: BC Managed Care – PPO | Attending: Nurse Practitioner

## 2022-02-11 DIAGNOSIS — C50412 Malignant neoplasm of upper-outer quadrant of left female breast: Secondary | ICD-10-CM | POA: Diagnosis present

## 2022-02-11 DIAGNOSIS — Z452 Encounter for adjustment and management of vascular access device: Secondary | ICD-10-CM | POA: Diagnosis not present

## 2022-02-11 DIAGNOSIS — M810 Age-related osteoporosis without current pathological fracture: Secondary | ICD-10-CM | POA: Diagnosis not present

## 2022-02-11 DIAGNOSIS — Z79899 Other long term (current) drug therapy: Secondary | ICD-10-CM | POA: Diagnosis not present

## 2022-02-11 DIAGNOSIS — Z95828 Presence of other vascular implants and grafts: Secondary | ICD-10-CM

## 2022-02-11 MED ORDER — SODIUM CHLORIDE 0.9% FLUSH
10.0000 mL | Freq: Once | INTRAVENOUS | Status: AC
  Administered 2022-02-11: 10 mL via INTRAVENOUS
  Filled 2022-02-11: qty 10

## 2022-02-11 MED ORDER — HEPARIN SOD (PORK) LOCK FLUSH 100 UNIT/ML IV SOLN
500.0000 [IU] | Freq: Once | INTRAVENOUS | Status: AC
  Administered 2022-02-11: 500 [IU] via INTRAVENOUS
  Filled 2022-02-11: qty 5

## 2022-03-20 ENCOUNTER — Inpatient Hospital Stay: Payer: BC Managed Care – PPO

## 2022-04-10 ENCOUNTER — Inpatient Hospital Stay: Payer: BC Managed Care – PPO

## 2022-05-19 ENCOUNTER — Inpatient Hospital Stay: Payer: BC Managed Care – PPO

## 2022-06-09 ENCOUNTER — Inpatient Hospital Stay: Payer: BC Managed Care – PPO

## 2022-06-16 ENCOUNTER — Inpatient Hospital Stay: Payer: BC Managed Care – PPO | Attending: Nurse Practitioner

## 2022-06-16 DIAGNOSIS — C50412 Malignant neoplasm of upper-outer quadrant of left female breast: Secondary | ICD-10-CM | POA: Diagnosis present

## 2022-06-16 DIAGNOSIS — Z452 Encounter for adjustment and management of vascular access device: Secondary | ICD-10-CM | POA: Insufficient documentation

## 2022-06-16 DIAGNOSIS — Z95828 Presence of other vascular implants and grafts: Secondary | ICD-10-CM

## 2022-06-16 MED ORDER — HEPARIN SOD (PORK) LOCK FLUSH 100 UNIT/ML IV SOLN
500.0000 [IU] | Freq: Once | INTRAVENOUS | Status: AC
  Administered 2022-06-16: 500 [IU] via INTRAVENOUS
  Filled 2022-06-16: qty 5

## 2022-06-16 MED ORDER — SODIUM CHLORIDE 0.9% FLUSH
10.0000 mL | Freq: Once | INTRAVENOUS | Status: AC
  Administered 2022-06-16: 10 mL via INTRAVENOUS
  Filled 2022-06-16: qty 10

## 2022-07-18 ENCOUNTER — Inpatient Hospital Stay: Payer: Medicare Other

## 2022-07-21 ENCOUNTER — Inpatient Hospital Stay: Payer: Medicare Other

## 2022-07-21 ENCOUNTER — Inpatient Hospital Stay: Payer: Medicare Other | Admitting: Nurse Practitioner

## 2022-07-29 ENCOUNTER — Ambulatory Visit: Payer: BC Managed Care – PPO | Admitting: Nurse Practitioner

## 2022-07-29 ENCOUNTER — Other Ambulatory Visit: Payer: BC Managed Care – PPO

## 2022-07-31 ENCOUNTER — Inpatient Hospital Stay: Payer: Medicare Other | Attending: Nurse Practitioner | Admitting: Nurse Practitioner

## 2022-07-31 ENCOUNTER — Other Ambulatory Visit: Payer: BC Managed Care – PPO

## 2022-07-31 ENCOUNTER — Ambulatory Visit: Payer: BC Managed Care – PPO | Admitting: Nurse Practitioner

## 2022-07-31 DIAGNOSIS — Z95828 Presence of other vascular implants and grafts: Secondary | ICD-10-CM

## 2022-07-31 DIAGNOSIS — Z08 Encounter for follow-up examination after completed treatment for malignant neoplasm: Secondary | ICD-10-CM

## 2022-07-31 DIAGNOSIS — Z853 Personal history of malignant neoplasm of breast: Secondary | ICD-10-CM

## 2022-07-31 NOTE — Progress Notes (Signed)
[facility]  Telephone:([phone]  [phone]   Virtual Visit Progress Note  I connected with Rachel Hall on 07/31/22 at  3:30 PM EDT by video enabled telemedicine visit and verified that I am speaking with the correct person using two identifiers.   I discussed the limitations, risks, security and privacy concerns of performing an evaluation and management service by telemedicine and the availability of in-person appointments. I also discussed with the patient that there may be a patient responsible charge related to this service. The patient expressed understanding and agreed to proceed.   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  INTERVAL HISTORY: Patient agrees to follow up via telemedicine for surveillance of breast cancer. She is suffering from exacerbation of her MS. Has a pressure sore on her sacrum. She denies breast pain, skin changes, or masses. She continues to receive infusions at [facility]. Tamoxifen has been discontinued. She's followed by pain management. Port is flushed at [facility].   REVIEW OF SYSTEMS:   Review of Systems  Constitutional:  Positive for malaise/fatigue. Negative for fever and weight loss.  Respiratory: Negative.  Negative for cough and shortness of breath.   Cardiovascular: Negative.  Negative for chest pain and leg swelling.  Gastrointestinal: Negative.  Negative for abdominal pain and constipation.  Genitourinary: Negative.  Negative for dysuria.  Musculoskeletal: Negative.  Negative for back pain.   Skin: Negative.  Negative for rash.  Neurological:  Positive for tremors, sensory change, focal weakness and weakness.  Psychiatric/Behavioral: Negative.  Negative for depression. The patient is not nervous/anxious.   As per HPI. Otherwise, a complete review of systems is negative.  PAST MEDICAL HISTORY: Past Medical History:  Diagnosis Date   Breast cancer (HCC) 2015   left breast cancer   Hypertension    MS (multiple sclerosis) (HCC)    unable to stand on her own   Personal history of chemotherapy    Personal history of radiation therapy    Post-menopausal    Presence of implanted infusion pump    right abdomen   Status post chemotherapy    left breast cancer   Status post radiation therapy    breast cancer    PAST SURGICAL HISTORY: Past Surgical History:  Procedure Laterality Date   BREAST BIOPSY Left    positive 2015   BREAST LUMPECTOMY Left 2015   invasive, f/u radiation.    COLONOSCOPY WITH PROPOFOL N/A 03/24/2018   Procedure: COLONOSCOPY WITH PROPOFOL;  Surgeon: Toledo, Boykin Nearing, MD;  Location: [facility] ENDOSCOPY;  Service: Gastroenterology;  Laterality: N/A;   ESOPHAGOGASTRODUODENOSCOPY (EGD) WITH PROPOFOL N/A 03/24/2018   Procedure: ESOPHAGOGASTRODUODENOSCOPY (EGD) WITH PROPOFOL;  Surgeon: Toledo, Boykin Nearing, MD;  Location: [facility] ENDOSCOPY;  Service: Gastroenterology;  Laterality: N/A;    FAMILY HISTORY Family History  Problem Relation Age of Onset   Breast cancer Mother 30    GYNECOLOGIC HISTORY:  Patient's last menstrual period was 03/25/2011.   ADVANCED DIRECTIVES:   HEALTH MAINTENANCE: Social History   Tobacco Use   Smoking status: Never   Smokeless tobacco: Never  Substance Use Topics  Alcohol use: No   Drug use: No    Colonoscopy:  PAP:  Bone density:  Lipid panel:  No Known Allergies  Current Outpatient Medications  Medication Sig Dispense Refill   acetaminophen (TYLENOL) 325 MG tablet Take by mouth.     alendronate (FOSAMAX) 70 MG tablet  TAKE ONE TABLET EVERY WEEK. TAKE WITH A FULL GLASS OF WATER ON AN EMPTY STOMACH. 12 tablet 0   amantadine (SYMMETREL) 100 MG capsule Take 100 mg by mouth daily.      amLODipine (NORVASC) 5 MG tablet      baclofen (LIORESAL) 10 MG tablet Take 10 mg by mouth.      Calcium Carbonate-Vitamin D 600-400 MG-UNIT per tablet Take 1 tablet by mouth 2 (two) times daily.      clonazePAM (KLONOPIN) 0.5 MG tablet      FLUoxetine (PROZAC) 20 MG capsule Take 20 mg by mouth daily.      omeprazole (PRILOSEC) 20 MG capsule Take 20 mg by mouth daily.  (Patient not taking: Reported on 07/18/2021)     oxybutynin (DITROPAN) 5 MG tablet Take 5 mg by mouth 2 (two) times daily.      pantoprazole (PROTONIX) 40 MG tablet Take 40 mg by mouth daily.     rivaroxaban (XARELTO) 20 MG TABS tablet Take 20 mg by mouth daily with supper.     No current facility-administered medications for this visit.   Facility-Administered Medications Ordered in Other Visits  Medication Dose Route Frequency Provider Last Rate Last Admin   sodium chloride 0.9 % injection 10 mL  10 mL Intravenous PRN Choksi, Valarie Cones, MD   10 mL at 06/23/14 1106    OBJECTIVE: LMP 03/25/2011    There is no height or weight on file to calculate BMI.    ECOG FS:3 - Symptomatic, >50% confined to bed  General: frail, in chair. Accompanied by mother.  Lungs: No audible wheezing or coughing. Musculoskeletal: deformity. Apparent spasticity Neuro: Alert, answering all questions appropriately.   LAB RESULTS: No visits with results within 3 Day(s) from this visit.  Latest known visit with results is:  Orders Only on 07/18/2021  Component Date Value Ref Range Status   CA 27.29 07/18/2021 35.4  0.0 - 38.6 U/mL Final   Comment: (NOTE) Siemens Centaur Immunochemiluminometric Methodology (ICMA) Values obtained with different assay methods or kits cannot be used interchangeably. Results cannot be interpreted as absolute evidence of the presence or absence of malignant  disease. Performed At: [facility] [address] [place], [place] [id number] Jolene Schimke MD WU:[id number]     STUDIES: No results found.  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 She completed her chemotherapy in approximately March 2016. Treatment was discontinued early secondary to worsening symptoms of her multiple sclerosis.  Patient completed 5 years of tamoxifen in April 2021.  Her most recent mammogram on November 07, 2021 was reported as bi-rads category: 1 negative. In view of her advancing MS we discussed if she would want to continue mammograms and she may consider discontinuation. For now, will order. Follow up in 1 year or can release surveillance to Dr. Dareen Piano. For now, patient wishes to continue surveillance at [facility].   2. Multiple sclerosis: Chronic and unchanged. She receives treatment with  [facility]. Is currently experiencing an exacerbation and increased debility. Continue follow up with [facility].    3. Osteoporosis: Patient's most recent bone mineral density on 09/10/21 was -3 consistent with osteoporosis. June 2022 T score -3.5, Previously, -2.8 and -3.6.  Continue Fosamax, calcium, and vitamin D. Fosamax is prescribed by PCP. Defer additional bone density to pcp.   4.  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  Previously recommended continue Xarelto lifelong. This is managed by Dr. Dareen Piano  5. Port:  Continue port flush every 8 weeks. These can also be performed at [facility] if she is receiving infusions.   Disposition: October- mammogram Port flushes every 8 weeks Virtual visit in 1 year of breast cancer- [place]    I discussed the assessment and treatment plan with the patient. The patient was provided an opportunity to ask questions and all were answered. The patient agreed with the plan and demonstrated an understanding of the instructions.   The patient was advised to call back or seek an in-person evaluation if the symptoms worsen or if the condition fails to improve as anticipated.   I spent 20 minutes face-to-face video visit time dedicated to the care of this patient on the date of this encounter to include pre-visit review of prior notes, mammogram and bone density, face-to-face time with the patient, and post visit ordering of testing/documentation.    Alinda Dooms, NP  07/31/2022

## 2022-08-08 ENCOUNTER — Inpatient Hospital Stay: Payer: Medicare Other

## 2022-08-26 ENCOUNTER — Inpatient Hospital Stay
Admission: EM | Admit: 2022-08-26 | Discharge: 2022-08-29 | DRG: 143 | Disposition: A | Discharge status: Home Health | Payer: Medicare Other | Attending: Internal Medicine | Admitting: Internal Medicine

## 2022-08-26 ENCOUNTER — Inpatient Hospital Stay: Payer: Medicare Other

## 2022-08-26 ENCOUNTER — Emergency Department: Payer: Medicare Other

## 2022-08-26 ENCOUNTER — Encounter: Payer: Self-pay | Admitting: Emergency Medicine

## 2022-08-26 ENCOUNTER — Other Ambulatory Visit: Payer: Self-pay

## 2022-08-26 DIAGNOSIS — K5909 Other constipation: Secondary | ICD-10-CM | POA: Diagnosis not present

## 2022-08-26 DIAGNOSIS — L0211 Cutaneous abscess of neck: Secondary | ICD-10-CM | POA: Diagnosis present

## 2022-08-26 DIAGNOSIS — Z1152 Encounter for screening for COVID-19: Secondary | ICD-10-CM

## 2022-08-26 DIAGNOSIS — Z7983 Long term (current) use of bisphosphonates: Secondary | ICD-10-CM

## 2022-08-26 DIAGNOSIS — L89154 Pressure ulcer of sacral region, stage 4: Secondary | ICD-10-CM | POA: Diagnosis present

## 2022-08-26 DIAGNOSIS — I10 Essential (primary) hypertension: Secondary | ICD-10-CM | POA: Diagnosis present

## 2022-08-26 DIAGNOSIS — M62838 Other muscle spasm: Secondary | ICD-10-CM | POA: Diagnosis present

## 2022-08-26 DIAGNOSIS — Z86718 Personal history of other venous thrombosis and embolism: Secondary | ICD-10-CM

## 2022-08-26 DIAGNOSIS — E876 Hypokalemia: Secondary | ICD-10-CM | POA: Diagnosis present

## 2022-08-26 DIAGNOSIS — E871 Hypo-osmolality and hyponatremia: Secondary | ICD-10-CM | POA: Diagnosis present

## 2022-08-26 DIAGNOSIS — L0291 Cutaneous abscess, unspecified: Secondary | ICD-10-CM

## 2022-08-26 DIAGNOSIS — G35 Multiple sclerosis: Secondary | ICD-10-CM | POA: Diagnosis present

## 2022-08-26 DIAGNOSIS — E872 Acidosis, unspecified: Secondary | ICD-10-CM | POA: Diagnosis present

## 2022-08-26 DIAGNOSIS — K759 Inflammatory liver disease, unspecified: Secondary | ICD-10-CM

## 2022-08-26 DIAGNOSIS — M272 Inflammatory conditions of jaws: Secondary | ICD-10-CM | POA: Diagnosis present

## 2022-08-26 DIAGNOSIS — R404 Transient alteration of awareness: Secondary | ICD-10-CM

## 2022-08-26 DIAGNOSIS — B379 Candidiasis, unspecified: Secondary | ICD-10-CM | POA: Diagnosis not present

## 2022-08-26 DIAGNOSIS — Z853 Personal history of malignant neoplasm of breast: Secondary | ICD-10-CM | POA: Diagnosis not present

## 2022-08-26 DIAGNOSIS — B370 Candidal stomatitis: Secondary | ICD-10-CM | POA: Diagnosis not present

## 2022-08-26 DIAGNOSIS — C50412 Malignant neoplasm of upper-outer quadrant of left female breast: Secondary | ICD-10-CM | POA: Diagnosis present

## 2022-08-26 DIAGNOSIS — Z9689 Presence of other specified functional implants: Secondary | ICD-10-CM | POA: Diagnosis present

## 2022-08-26 DIAGNOSIS — R7989 Other specified abnormal findings of blood chemistry: Secondary | ICD-10-CM | POA: Diagnosis present

## 2022-08-26 DIAGNOSIS — L8990 Pressure ulcer of unspecified site, unspecified stage: Secondary | ICD-10-CM

## 2022-08-26 DIAGNOSIS — Z923 Personal history of irradiation: Secondary | ICD-10-CM | POA: Diagnosis not present

## 2022-08-26 DIAGNOSIS — Z9221 Personal history of antineoplastic chemotherapy: Secondary | ICD-10-CM | POA: Diagnosis not present

## 2022-08-26 DIAGNOSIS — G8194 Hemiplegia, unspecified affecting left nondominant side: Secondary | ICD-10-CM | POA: Diagnosis present

## 2022-08-26 DIAGNOSIS — K047 Periapical abscess without sinus: Secondary | ICD-10-CM | POA: Diagnosis present

## 2022-08-26 DIAGNOSIS — Z7401 Bed confinement status: Secondary | ICD-10-CM

## 2022-08-26 DIAGNOSIS — Z79899 Other long term (current) drug therapy: Secondary | ICD-10-CM | POA: Diagnosis not present

## 2022-08-26 DIAGNOSIS — Z7901 Long term (current) use of anticoagulants: Secondary | ICD-10-CM

## 2022-08-26 DIAGNOSIS — R55 Syncope and collapse: Secondary | ICD-10-CM | POA: Diagnosis not present

## 2022-08-26 DIAGNOSIS — Z803 Family history of malignant neoplasm of breast: Secondary | ICD-10-CM

## 2022-08-26 DIAGNOSIS — R413 Other amnesia: Secondary | ICD-10-CM | POA: Diagnosis present

## 2022-08-26 DIAGNOSIS — R220 Localized swelling, mass and lump, head: Secondary | ICD-10-CM

## 2022-08-26 DIAGNOSIS — K5900 Constipation, unspecified: Secondary | ICD-10-CM | POA: Diagnosis not present

## 2022-08-26 DIAGNOSIS — B954 Other streptococcus as the cause of diseases classified elsewhere: Secondary | ICD-10-CM | POA: Diagnosis present

## 2022-08-26 DIAGNOSIS — R6520 Severe sepsis without septic shock: Principal | ICD-10-CM

## 2022-08-26 DIAGNOSIS — L89159 Pressure ulcer of sacral region, unspecified stage: Secondary | ICD-10-CM

## 2022-08-26 DIAGNOSIS — A419 Sepsis, unspecified organism: Principal | ICD-10-CM

## 2022-08-26 LAB — CBC WITH DIFFERENTIAL/PLATELET
Abs Immature Granulocytes: 0.3 10*3/uL — ABNORMAL HIGH (ref 0.00–0.07)
Basophils Absolute: 0.1 10*3/uL (ref 0.0–0.1)
Basophils Relative: 1 %
Eosinophils Absolute: 0 10*3/uL (ref 0.0–0.5)
Eosinophils Relative: 0 %
HCT: 41.8 % (ref 36.0–46.0)
Hemoglobin: 14.4 g/dL (ref 12.0–15.0)
Immature Granulocytes: 1 %
Lymphocytes Relative: 3 %
Lymphs Abs: 0.6 10*3/uL — ABNORMAL LOW (ref 0.7–4.0)
MCH: 27.1 pg (ref 26.0–34.0)
MCHC: 34.4 g/dL (ref 30.0–36.0)
MCV: 78.7 fL — ABNORMAL LOW (ref 80.0–100.0)
Monocytes Absolute: 1.3 10*3/uL — ABNORMAL HIGH (ref 0.1–1.0)
Monocytes Relative: 6 %
Neutro Abs: 20.4 10*3/uL — ABNORMAL HIGH (ref 1.7–7.7)
Neutrophils Relative %: 89 %
Platelets: 589 10*3/uL — ABNORMAL HIGH (ref 150–400)
RBC: 5.31 MIL/uL — ABNORMAL HIGH (ref 3.87–5.11)
RDW: 15.8 % — ABNORMAL HIGH (ref 11.5–15.5)
WBC: 22.8 10*3/uL — ABNORMAL HIGH (ref 4.0–10.5)
nRBC: 0 % (ref 0.0–0.2)

## 2022-08-26 LAB — HEPATIC FUNCTION PANEL
ALT: 75 U/L — ABNORMAL HIGH (ref 0–44)
AST: 147 U/L — ABNORMAL HIGH (ref 15–41)
Albumin: 3 g/dL — ABNORMAL LOW (ref 3.5–5.0)
Alkaline Phosphatase: 169 U/L — ABNORMAL HIGH (ref 38–126)
Bilirubin, Direct: 0.3 mg/dL — ABNORMAL HIGH (ref 0.0–0.2)
Indirect Bilirubin: 0.9 mg/dL (ref 0.3–0.9)
Total Bilirubin: 1.2 mg/dL (ref 0.3–1.2)
Total Protein: 6.7 g/dL (ref 6.5–8.1)

## 2022-08-26 LAB — BASIC METABOLIC PANEL
Anion gap: 14 (ref 5–15)
BUN: 10 mg/dL (ref 6–20)
CO2: 25 mmol/L (ref 22–32)
Calcium: 8.6 mg/dL — ABNORMAL LOW (ref 8.9–10.3)
Chloride: 93 mmol/L — ABNORMAL LOW (ref 98–111)
Creatinine, Ser: 0.41 mg/dL — ABNORMAL LOW (ref 0.44–1.00)
GFR, Estimated: >60 mL/min (ref >60)
Glucose, Bld: 171 mg/dL — ABNORMAL HIGH (ref 70–99)
Potassium: 3.1 mmol/L — ABNORMAL LOW (ref 3.5–5.1)
Sodium: 132 mmol/L — ABNORMAL LOW (ref 135–145)

## 2022-08-26 LAB — RESP PANEL BY RT-PCR (RSV, FLU A&B, COVID)  RVPGX2
Influenza A by PCR: NEGATIVE
Influenza B by PCR: NEGATIVE
Resp Syncytial Virus by PCR: NEGATIVE
SARS Coronavirus 2 by RT PCR: NEGATIVE

## 2022-08-26 LAB — CBC
HCT: 36.1 % (ref 36.0–46.0)
Hemoglobin: 11.8 g/dL — ABNORMAL LOW (ref 12.0–15.0)
MCH: 27.3 pg (ref 26.0–34.0)
MCHC: 32.7 g/dL (ref 30.0–36.0)
MCV: 83.6 fL (ref 80.0–100.0)
Platelets: 435 10*3/uL — ABNORMAL HIGH (ref 150–400)
RBC: 4.32 MIL/uL (ref 3.87–5.11)
RDW: 15.9 % — ABNORMAL HIGH (ref 11.5–15.5)
WBC: 22.9 10*3/uL — ABNORMAL HIGH (ref 4.0–10.5)
nRBC: 0 % (ref 0.0–0.2)

## 2022-08-26 LAB — LACTIC ACID, PLASMA
Lactic Acid, Venous: 2.9 mmol/L (ref 0.5–1.9)
Lactic Acid, Venous: 3.2 mmol/L (ref 0.5–1.9)
Lactic Acid, Venous: 1.9 mmol/L (ref 0.5–1.9)

## 2022-08-26 LAB — CREATININE, SERUM
Creatinine, Ser: 0.38 mg/dL — ABNORMAL LOW (ref 0.44–1.00)
GFR, Estimated: >60 mL/min (ref >60)

## 2022-08-26 MED ORDER — LACTATED RINGERS IV BOLUS
1000.0000 mL | Freq: Once | INTRAVENOUS | Status: AC
  Administered 2022-08-26: 1000 mL via INTRAVENOUS

## 2022-08-26 MED ORDER — DEXAMETHASONE SODIUM PHOSPHATE 10 MG/ML IJ SOLN
10.0000 mg | Freq: Three times a day (TID) | INTRAMUSCULAR | Status: DC
  Administered 2022-08-26: 10 mg via INTRAVENOUS
  Filled 2022-08-26: qty 1

## 2022-08-26 MED ORDER — SODIUM CHLORIDE 0.9 % IV BOLUS
1000.0000 mL | Freq: Once | INTRAVENOUS | Status: AC
  Administered 2022-08-26: 1000 mL via INTRAVENOUS

## 2022-08-26 MED ORDER — VANCOMYCIN HCL IN DEXTROSE 1-5 GM/200ML-% IV SOLN
1000.0000 mg | Freq: Once | INTRAVENOUS | Status: AC
  Administered 2022-08-26: 1000 mg via INTRAVENOUS
  Filled 2022-08-26: qty 200

## 2022-08-26 MED ORDER — SODIUM CHLORIDE 0.9 % IV SOLN: INTRAVENOUS | Status: DC

## 2022-08-26 MED ORDER — SODIUM CHLORIDE 0.9 % IV SOLN: 3.0000 g | Freq: Three times a day (TID) | INTRAVENOUS | Status: DC

## 2022-08-26 MED ORDER — SODIUM CHLORIDE 0.9 % IV SOLN
2.0000 g | Freq: Once | INTRAVENOUS | Status: AC
  Administered 2022-08-26: 2 g via INTRAVENOUS
  Filled 2022-08-26: qty 20

## 2022-08-26 MED ORDER — ENOXAPARIN SODIUM 40 MG/0.4ML IJ SOSY
40.0000 mg | PREFILLED_SYRINGE | INTRAMUSCULAR | Status: DC
  Administered 2022-08-28: 40 mg via SUBCUTANEOUS
  Filled 2022-08-26: qty 0.4

## 2022-08-26 MED ORDER — POLYETHYLENE GLYCOL 3350 17 G PO PACK: 17.0000 g | PACK | Freq: Every day | ORAL | Status: DC | PRN

## 2022-08-26 MED ORDER — DEXTROSE IN LACTATED RINGERS 5 % IV SOLN: INTRAVENOUS | Status: DC

## 2022-08-26 MED ORDER — SODIUM CHLORIDE 0.9 % IV SOLN
3.0000 g | Freq: Four times a day (QID) | INTRAVENOUS | Status: DC
  Administered 2022-08-27 – 2022-08-29 (×9): 3 g via INTRAVENOUS
  Filled 2022-08-26 (×10): qty 8

## 2022-08-26 MED ORDER — AMANTADINE HCL 100 MG PO CAPS
100.0000 mg | ORAL_CAPSULE | Freq: Every day | ORAL | Status: DC
  Administered 2022-08-28 – 2022-08-29 (×2): 100 mg via ORAL
  Filled 2022-08-26 (×3): qty 1

## 2022-08-26 MED ORDER — OYSTER SHELL CALCIUM/D3 500-5 MG-MCG PO TABS
1.0000 | ORAL_TABLET | Freq: Two times a day (BID) | ORAL | Status: DC
  Administered 2022-08-27 – 2022-08-29 (×4): 1 via ORAL
  Filled 2022-08-26 (×5): qty 1

## 2022-08-26 MED ORDER — SODIUM CHLORIDE 0.9% FLUSH
3.0000 mL | Freq: Two times a day (BID) | INTRAVENOUS | Status: DC
  Administered 2022-08-26 – 2022-08-29 (×4): 3 mL via INTRAVENOUS

## 2022-08-26 MED ORDER — ACETAMINOPHEN 650 MG RE SUPP: 650.0000 mg | Freq: Four times a day (QID) | RECTAL | Status: DC | PRN

## 2022-08-26 MED ORDER — ACETAMINOPHEN 325 MG PO TABS
650.0000 mg | ORAL_TABLET | Freq: Four times a day (QID) | ORAL | Status: DC | PRN
  Administered 2022-08-27: 650 mg via ORAL
  Filled 2022-08-26: qty 2

## 2022-08-26 MED ORDER — PANTOPRAZOLE SODIUM 40 MG PO TBEC
40.0000 mg | DELAYED_RELEASE_TABLET | Freq: Every day | ORAL | Status: DC
  Administered 2022-08-28 – 2022-08-29 (×2): 40 mg via ORAL
  Filled 2022-08-26 (×3): qty 1

## 2022-08-26 MED ORDER — POTASSIUM CHLORIDE 10 MEQ/100ML IV SOLN
10.0000 meq | INTRAVENOUS | Status: DC
  Administered 2022-08-26 (×2): 10 meq via INTRAVENOUS
  Filled 2022-08-26 (×3): qty 100

## 2022-08-26 MED ORDER — IOHEXOL 300 MG/ML  SOLN
100.0000 mL | Freq: Once | INTRAMUSCULAR | Status: AC | PRN
  Administered 2022-08-26: 75 mL via INTRAVENOUS

## 2022-08-26 MED ORDER — SODIUM CHLORIDE 0.9 % IV SOLN
3.0000 g | Freq: Once | INTRAVENOUS | Status: AC
  Administered 2022-08-26: 3 g via INTRAVENOUS
  Filled 2022-08-26: qty 8

## 2022-08-26 NOTE — Assessment & Plan Note (Signed)
I will hold all antihypertensives of the patient for this evening, use as needed agents only

## 2022-08-26 NOTE — ED Notes (Signed)
Md in with pt now. 

## 2022-08-26 NOTE — Assessment & Plan Note (Addendum)
Patient back on Xarelto

## 2022-08-26 NOTE — Assessment & Plan Note (Addendum)
With history of MS chronic left-sided hemiparesis.  Patient has a baclofen pump in situ, this is apparently still being used.  Monitor clinically.

## 2022-08-26 NOTE — Assessment & Plan Note (Signed)
This is a remote diagnosis.  Currently felt to be in remission.  Patient has a port

## 2022-08-26 NOTE — Sepsis Progress Note (Signed)
Elink will follow per sepsis protocol  

## 2022-08-26 NOTE — Consult Note (Signed)
Rachel Hall, Rachel Hall [id number] 28-Dec-1965 Trinna Post, MD  Reason for Consult: Neck abscess  HPI: Started with neck swelling and sore throat on Saturday.  Has gotten progressively worse.  Presented to ED with neck swelling and fever.  No airway issues noted by staff.  Mom is the historian as Rachel Hall has MS.    Allergies: No Known Allergies  ROS: Review of systems normal other than 12 systems except per HPI.  PMH:  Past Medical History:  Diagnosis Date   Breast cancer (HCC) 2015   left breast cancer   Hypertension    MS (multiple sclerosis) (HCC)    unable to stand on her own   Personal history of chemotherapy    Personal history of radiation therapy    Post-menopausal    Presence of implanted infusion pump    right abdomen   Status post chemotherapy    left breast cancer   Status post radiation therapy    breast cancer    FH:  Family History  Problem Relation Age of Onset   Breast cancer Mother 66    SH:  Social History   Socioeconomic History   Marital status: Married    Spouse name: Not on file   Number of children: Not on file   Years of education: Not on file   Highest education level: Not on file  Occupational History   Not on file  Tobacco Use   Smoking status: Never   Smokeless tobacco: Never  Substance and Sexual Activity   Alcohol use: No   Drug use: No   Sexual activity: Not on file  Other Topics Concern   Not on file  Social History Narrative   Not on file   Social Determinants of Health   Financial Resource Strain: Not on file  Food Insecurity: Not on file  Transportation Needs: Not on file  Physical Activity: Not on file  Stress: Not on file  Social Connections: Not on file  Intimate Partner Violence: Not on file    PSH:  Past Surgical History:  Procedure Laterality Date   BREAST BIOPSY Left    positive 2015   BREAST LUMPECTOMY Left 2015   invasive, f/u radiation.    COLONOSCOPY WITH PROPOFOL N/A 03/24/2018   Procedure: COLONOSCOPY WITH  PROPOFOL;  Surgeon: Toledo, Boykin Nearing, MD;  Location: [facility] ENDOSCOPY;  Service: Gastroenterology;  Laterality: N/A;   ESOPHAGOGASTRODUODENOSCOPY (EGD) WITH PROPOFOL N/A 03/24/2018   Procedure: ESOPHAGOGASTRODUODENOSCOPY (EGD) WITH PROPOFOL;  Surgeon: Toledo, Boykin Nearing, MD;  Location: [facility] ENDOSCOPY;  Service: Gastroenterology;  Laterality: N/A;    CT scan-left neck abscess likely source is the periapical tooth abscess adjacent.  No airway compromise noted.  I've spoken with Rachel Hall's mom over the phone.  I would recommend I & D of neck abscess.  I've sent a message to an oral surgery colleague to see if he is available to remove the tooth and drain the abscess but haven't heard back from him yet.  I discussed the risks and benefits with mom, including bleeding/infection/airway compromise.  I answered all questions from her.  She is not sure if she or Rachel Hall's husband is her legal power of attorney-either of which will need to sign the consent.  According to ED attending, she also has infected decubitus ulcer which could also be a source of her sepsis.  She also has severe MS and receives infusions at [facility].  The hospitalist group will admit her overnight and begin her workup.  I've spoken with the  OR, we have a planned 715 am start for I & D of neck abscess.  Will keep her npo overnight.  I will see her early in the am prior to surgery.     A/P:  Neck abscess likely from periapical tooth abscess.  Will admit to hospitalist overnight, plan I & D first case in AM.  Recommend Unasyn 3 gm IV q6 hours and Decadron 10mg  IV q 8hours.  If family has any further questions, have nursing reach out to me.     Davina Poke 08/26/2022 5:54 PM

## 2022-08-26 NOTE — Progress Notes (Signed)
PHARMACY -  BRIEF ANTIBIOTIC NOTE   Pharmacy has received consult for vancomycin from an ED provider.  The patient's profile has been reviewed for ht/wt/allergies/indication/available labs.    One time order placed for vancomycin 1,000 mg x 1  Further antibiotics/pharmacy consults should be ordered by admitting physician if indicated.                       Thank you,  Elliot Gurney, PharmD, BCPS Clinical Pharmacist  08/26/2022 2:38 PM

## 2022-08-26 NOTE — Assessment & Plan Note (Addendum)
With leukocytosis.  Patient has abscess of the around the left mandibular body.  Patient brought to the operating room by Dr. Jenne Campus for drainage of abscess 7/17.  Patient was on Unasyn and vancomycin.  Vancomycin discontinued with Streptococcus intermedius growing out of culture.  Switch over to Augmentin for total of 2 weeks upon going home.  Patient has an appointment with a dentist to remove the tooth.

## 2022-08-26 NOTE — ED Triage Notes (Signed)
Patient to ED via POV from [facility] for facial swelling to left side of face/neck. Swelling noted since Saturday. Denies difficulties breathing- family member reports difficulties swallowing more than normal. Hx of MS. C/o throat and left ear pain.

## 2022-08-26 NOTE — Progress Notes (Signed)
08/26/2022 10:55 PM  Rachel Hall [id number]  Hospital day 1, I stop by to see the patient, according to mom and the patient not a significant change in her physical exam.  Some mild fullness in the submandibular region but the swelling up around the ear has decreased.    Temp:  [97.8 F (36.6 C)-98 F (36.7 C)] 98 F (36.7 C) (07/16 1637) Pulse Rate:  [103-113] 108 (07/16 1930) Resp:  [18] 18 (07/16 1757) BP: (130-136)/(80-83) 134/81 (07/16 1930) SpO2:  [94 %-100 %] 97 % (07/16 1930) Weight:  [57.6 kg] 57.6 kg (07/16 1419),    No intake or output data in the 24 hours ending 08/26/22 2255  Results for orders placed or performed during the hospital encounter of 08/26/22 (from the past 24 hour(s))  CBC with Differential     Status: Abnormal   Collection Time: 08/26/22  1:05 PM  Result Value Ref Range   WBC 22.8 (H) 4.0 - 10.5 K/uL   RBC 5.31 (H) 3.87 - 5.11 MIL/uL   Hemoglobin 14.4 12.0 - 15.0 g/dL   HCT 10.2 72.5 - 36.6 %   MCV 78.7 (L) 80.0 - 100.0 fL   MCH 27.1 26.0 - 34.0 pg   MCHC 34.4 30.0 - 36.0 g/dL   RDW 44.0 (H) 34.7 - 42.5 %   Platelets 589 (H) 150 - 400 K/uL   nRBC 0.0 0.0 - 0.2 %   Neutrophils Relative % 89 %   Neutro Abs 20.4 (H) 1.7 - 7.7 K/uL   Lymphocytes Relative 3 %   Lymphs Abs 0.6 (L) 0.7 - 4.0 K/uL   Monocytes Relative 6 %   Monocytes Absolute 1.3 (H) 0.1 - 1.0 K/uL   Eosinophils Relative 0 %   Eosinophils Absolute 0.0 0.0 - 0.5 K/uL   Basophils Relative 1 %   Basophils Absolute 0.1 0.0 - 0.1 K/uL   Immature Granulocytes 1 %   Abs Immature Granulocytes 0.30 (H) 0.00 - 0.07 K/uL  Basic metabolic panel     Status: Abnormal   Collection Time: 08/26/22  1:05 PM  Result Value Ref Range   Sodium 132 (L) 135 - 145 mmol/L   Potassium 3.1 (L) 3.5 - 5.1 mmol/L   Chloride 93 (L) 98 - 111 mmol/L   CO2 25 22 - 32 mmol/L   Glucose, Bld 171 (H) 70 - 99 mg/dL   BUN 10 6 - 20 mg/dL   Creatinine, Ser 9.56 (L) 0.44 - 1.00 mg/dL   Calcium 8.6 (L) 8.9 - 10.3  mg/dL   GFR, Estimated >38 >75 mL/min   Anion gap 14 5 - 15  Resp panel by RT-PCR (RSV, Flu A&B, Covid) Anterior Nasal Swab     Status: None   Collection Time: 08/26/22  2:07 PM   Specimen: Anterior Nasal Swab  Result Value Ref Range   SARS Coronavirus 2 by RT PCR NEGATIVE NEGATIVE   Influenza A by PCR NEGATIVE NEGATIVE   Influenza B by PCR NEGATIVE NEGATIVE   Resp Syncytial Virus by PCR NEGATIVE NEGATIVE  Lactic acid, plasma     Status: Abnormal   Collection Time: 08/26/22  2:31 PM  Result Value Ref Range   Lactic Acid, Venous 2.9 (HH) 0.5 - 1.9 mmol/L  Hepatic function panel     Status: Abnormal   Collection Time: 08/26/22  2:31 PM  Result Value Ref Range   Total Protein 6.7 6.5 - 8.1 g/dL   Albumin 3.0 (L) 3.5 - 5.0 g/dL   AST  147 (H) 15 - 41 U/L   ALT 75 (H) 0 - 44 U/L   Alkaline Phosphatase 169 (H) 38 - 126 U/L   Total Bilirubin 1.2 0.3 - 1.2 mg/dL   Bilirubin, Direct 0.3 (H) 0.0 - 0.2 mg/dL   Indirect Bilirubin 0.9 0.3 - 0.9 mg/dL  Lactic acid, plasma     Status: Abnormal   Collection Time: 08/26/22  6:25 PM  Result Value Ref Range   Lactic Acid, Venous 3.2 (HH) 0.5 - 1.9 mmol/L  CBC     Status: Abnormal   Collection Time: 08/26/22  9:55 PM  Result Value Ref Range   WBC 22.9 (H) 4.0 - 10.5 K/uL   RBC 4.32 3.87 - 5.11 MIL/uL   Hemoglobin 11.8 (L) 12.0 - 15.0 g/dL   HCT 16.1 09.6 - 04.5 %   MCV 83.6 80.0 - 100.0 fL   MCH 27.3 26.0 - 34.0 pg   MCHC 32.7 30.0 - 36.0 g/dL   RDW 40.9 (H) 81.1 - 91.4 %   Platelets 435 (H) 150 - 400 K/uL   nRBC 0.0 0.0 - 0.2 %  Creatinine, serum     Status: Abnormal   Collection Time: 08/26/22  9:55 PM  Result Value Ref Range   Creatinine, Ser 0.38 (L) 0.44 - 1.00 mg/dL   GFR, Estimated >78 >29 mL/min  Lactic acid, plasma     Status: None   Collection Time: 08/26/22  9:55 PM  Result Value Ref Range   Lactic Acid, Venous 1.9 0.5 - 1.9 mmol/L    SUBJECTIVE: Patient is feeling about the same, pain still present but  tolerable.  OBJECTIVE: There is significant swelling over the angle of the mandible that extends into the submandibular region.  The area is somewhat fluctuant however there is not significant erythema noted.  Examination the oral cavity oropharynx shows severe dry mucous membranes there does appear to be some pus draining around that left posterior molar tooth.  The patient has no evidence of floor mouth swelling no evidence of tongue swelling.  Her voice is normal, there is no stridor noted.  IMPRESSION: Periapical tooth abscess based on the exam and the CT there does not appear to be any airway compromise at present.  I had a long talk with Rachel Hall and her mom.  There is no question that this needs to have an incision and drainage performed to clean this area out and placed packing.  She is scheduled for a first case in the morning and based on my exam tonight I believe it is safe to wait until that time.  Spoke with nursing if there is any significant change in her exam particularly as far as airway is concerned they will let me know.  We also discussed aspirating this.  This could be easily done but this area needs to be opened and thoroughly cleaned which is best done in the operating room along with placement iodoform packing.  They understand and are in agreement.  Again we discussed the risk and benefits of the surgery tomorrow including bleeding infection possible airway compromise and they are eager to proceed.  She is scheduled for a first started approximately 7 to 7:15 AM.  PLAN: Scheduled for incision and drainage in the operating room first thing in the morning.  She is complaining of some pain, I will defer to the hospitalist for pain control.  She is to be n.p.o. after midnight for surgery in the morning.  IV medication such as morphine  if tolerable would be a good option.  Davina Poke 08/26/2022, 10:55 PM

## 2022-08-26 NOTE — Progress Notes (Signed)
CODE SEPSIS - PHARMACY COMMUNICATION  **Broad Spectrum Antibiotics should be administered within 1 hour of Sepsis diagnosis**  Time Code Sepsis Called/Page Received: 1405  Antibiotics Ordered: vancomycin and ceftriaxone  Time of 1st antibiotic administration: 1621  Additional action taken by pharmacy: messaged  If necessary, Name of Provider/Nurse Contacted: RN    Elliot Gurney, PharmD, BCPS Clinical Pharmacist  08/26/2022 2:39 PM

## 2022-08-26 NOTE — ED Notes (Signed)
First nurse note: Pt here via [facility] with c/o of "swollen parotid gland". LPN states "I think she has mumps". Mom is caregiver for pt.

## 2022-08-26 NOTE — Assessment & Plan Note (Deleted)
This is seen incidentally.  Exam is limited as patient has an baclofen pump over the right upper quadrant.  Will get an ultrasound and acute hepatitis panel in the morning. F/u inr. ptt

## 2022-08-26 NOTE — Assessment & Plan Note (Addendum)
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  Present on admission.  Continue local wound care with [facility].  Consider outpatient referral to plastic surgery for repair.

## 2022-08-26 NOTE — H&P (Addendum)
History and Physical    Patient: Rachel Hall ZOX:[id number] DOB: 10/06/1965 DOA: 08/26/2022 DOS: the patient was seen and examined on 08/26/2022 PCP: Lauro Regulus, MD  Patient coming from: Home  Chief Complaint:  Chief Complaint  Patient presents with   Facial Swelling   HPI: Rachel Hall is a 57 y.o. female with medical history significant of multiple sclerosis.  Patient at baseline has complete palsy of the left lower extremity, almost complete palsy of the right lower extremity, marked palsy of the left upper extremity, some preservation of antigravity strength in the right upper extremity.  She also has chronic cognitive deficit manifesting as memory loss, neglect of symptoms, and generally poor appetite.  Patient has been bedbound for approximately 3 months.  Patient also has prior known sacral decubitus ulcer.  That has been managed by nurse who is in the family.  Per family, husband and daughter at the bedside, the wound has actually been getting better over this period of time.  Patient was in her usual state of health till about 4 days ago when patient had even a worse appetite and refusing to eat and drink much.  There is no report of fever or rigors.  Subsequently patient was noted over the last 48 hours to have a swelling developing just above the left angle of the jaw as well as just inferior to the angle of the jaw.  Subsequently patient started reporting pain at the site with attempts to feed her and patient was noted to have trouble opening her mouth.  Patient is brought to the ER.  During this encounter most of the history is from the family and record review and signout.  Patient is not much helpful in providing history. Review of Systems: unable to review all systems due to the inability of the patient to answer questions. Past Medical History:  Diagnosis Date   Breast cancer (HCC) 2015   left breast cancer   Hypertension    MS (multiple sclerosis)  (HCC)    unable to stand on her own   Personal history of chemotherapy    Personal history of radiation therapy    Post-menopausal    Presence of implanted infusion pump    right abdomen   Status post chemotherapy    left breast cancer   Status post radiation therapy    breast cancer   Past Surgical History:  Procedure Laterality Date   BREAST BIOPSY Left    positive 2015   BREAST LUMPECTOMY Left 2015   invasive, f/u radiation.    COLONOSCOPY WITH PROPOFOL N/A 03/24/2018   Procedure: COLONOSCOPY WITH PROPOFOL;  Surgeon: Toledo, Boykin Nearing, MD;  Location: [facility] ENDOSCOPY;  Service: Gastroenterology;  Laterality: N/A;   ESOPHAGOGASTRODUODENOSCOPY (EGD) WITH PROPOFOL N/A 03/24/2018   Procedure: ESOPHAGOGASTRODUODENOSCOPY (EGD) WITH PROPOFOL;  Surgeon: Toledo, Boykin Nearing, MD;  Location: [facility] ENDOSCOPY;  Service: Gastroenterology;  Laterality: N/A;   Social History:  reports that she has never smoked. She has never used smokeless tobacco. She reports that she does not drink alcohol and does not use drugs.  No Known Allergies  Family History  Problem Relation Age of Onset   Breast cancer Mother 40    Prior to Admission medications   Medication Sig Start Date End Date Taking? Authorizing Provider  acetaminophen (TYLENOL) 325 MG tablet Take by mouth.    [provider]  alendronate (FOSAMAX) 70 MG tablet TAKE ONE TABLET EVERY WEEK. TAKE WITH A FULL GLASS OF WATER ON  AN EMPTY STOMACH. 08/27/16   Jeralyn Ruths, MD  amantadine (SYMMETREL) 100 MG capsule Take 100 mg by mouth daily.  06/14/14   [provider]  amLODipine (NORVASC) 5 MG tablet  06/07/14   [provider]  baclofen (LIORESAL) 10 MG tablet Take 10 mg by mouth.  08/01/14   [provider]  Calcium Carbonate-Vitamin D 600-400 MG-UNIT per tablet Take 1 tablet by mouth 2 (two) times daily.     [provider]  clonazePAM (KLONOPIN) 0.5 MG tablet  04/15/17   [provider]   FLUoxetine (PROZAC) 20 MG capsule Take 20 mg by mouth daily.  08/17/14   [provider]  omeprazole (PRILOSEC) 20 MG capsule Take 20 mg by mouth daily.  Patient not taking: Reported on 07/18/2021 07/18/14   [provider]  oxybutynin (DITROPAN) 5 MG tablet Take 5 mg by mouth 2 (two) times daily.  06/26/14   [provider]  pantoprazole (PROTONIX) 40 MG tablet Take 40 mg by mouth daily.    [provider]  rivaroxaban (XARELTO) 20 MG TABS tablet Take 20 mg by mouth daily with supper.    [provider]    Physical Exam: Vitals:   08/26/22 1219 08/26/22 1419 08/26/22 1637 08/26/22 1757  BP: 136/83  130/80 130/82  Pulse: (!) 113  (!) 110 (!) 103  Resp: 18  18 18   Temp: 97.8 F (36.6 C)  98 F (36.7 C)   TempSrc: Oral  Oral   SpO2: 94%  95% 100%  Weight:  57.6 kg    Height:  5\' 2"  (1.575 m)     General: Patient is awake, restful, really does not give meaningful answers to most questions.  Makes eye contact, able to turn her head and track.  Conversant, however soft-spoken, gives nonspecific responses.  Occasionally gives inappropriate responses as well.  For example when I was asking the patient to take deep breaths for auscultation of the chest, she is apologized saying that she had "done it in reverse ".  No distress Respiratory exam: Bilateral intravesicular Cardiovascular exam S1-S2 normal Abdomen soft nontender Extremities warm without edema.  Neuromuscular exam: Consistent with what is described in the HPI above.  Patient has no sensation in the left lower extremity. Patient has a sacral decubitus ulcer that is midline at approximately 7 cm x 5 cm undermined.  With scant discharge watery/mucoid, however no necrosis and no gangrene is seen.  No foul smell is seen. Deep atleast to fascia. Patient has obvious swelling involving the left angle of her jaw extending from just around the tragus to well medial to the angle of the jaw.  It is firm,  markedly tender.  No skin changes are noted.  Patient has trismus able to open her mouth just to 2 fingerbreadths wide in between the teeth.  There is gum tenderness.  Dentition is poor, exam is limited as patient is not able to open her mouth.  No drooling is seen, breathing is without labor Data Reviewed:  Labs on Admission:  Results for orders placed or performed during the hospital encounter of 08/26/22 (from the past 24 hour(s))  CBC with Differential     Status: Abnormal   Collection Time: 08/26/22  1:05 PM  Result Value Ref Range   WBC 22.8 (H) 4.0 - 10.5 K/uL   RBC 5.31 (H) 3.87 - 5.11 MIL/uL   Hemoglobin 14.4 12.0 - 15.0 g/dL   HCT 16.1 09.6 - 04.5 %  MCV 78.7 (L) 80.0 - 100.0 fL   MCH 27.1 26.0 - 34.0 pg   MCHC 34.4 30.0 - 36.0 g/dL   RDW 46.9 (H) 62.9 - 52.8 %   Platelets 589 (H) 150 - 400 K/uL   nRBC 0.0 0.0 - 0.2 %   Neutrophils Relative % 89 %   Neutro Abs 20.4 (H) 1.7 - 7.7 K/uL   Lymphocytes Relative 3 %   Lymphs Abs 0.6 (L) 0.7 - 4.0 K/uL   Monocytes Relative 6 %   Monocytes Absolute 1.3 (H) 0.1 - 1.0 K/uL   Eosinophils Relative 0 %   Eosinophils Absolute 0.0 0.0 - 0.5 K/uL   Basophils Relative 1 %   Basophils Absolute 0.1 0.0 - 0.1 K/uL   Immature Granulocytes 1 %   Abs Immature Granulocytes 0.30 (H) 0.00 - 0.07 K/uL  Basic metabolic panel     Status: Abnormal   Collection Time: 08/26/22  1:05 PM  Result Value Ref Range   Sodium 132 (L) 135 - 145 mmol/L   Potassium 3.1 (L) 3.5 - 5.1 mmol/L   Chloride 93 (L) 98 - 111 mmol/L   CO2 25 22 - 32 mmol/L   Glucose, Bld 171 (H) 70 - 99 mg/dL   BUN 10 6 - 20 mg/dL   Creatinine, Ser 4.13 (L) 0.44 - 1.00 mg/dL   Calcium 8.6 (L) 8.9 - 10.3 mg/dL   GFR, Estimated >24 >40 mL/min   Anion gap 14 5 - 15  Resp panel by RT-PCR (RSV, Flu A&B, Covid) Anterior Nasal Swab     Status: None   Collection Time: 08/26/22  2:07 PM   Specimen: Anterior Nasal Swab  Result Value Ref Range   SARS Coronavirus 2 by RT PCR NEGATIVE  NEGATIVE   Influenza A by PCR NEGATIVE NEGATIVE   Influenza B by PCR NEGATIVE NEGATIVE   Resp Syncytial Virus by PCR NEGATIVE NEGATIVE  Lactic acid, plasma     Status: Abnormal   Collection Time: 08/26/22  2:31 PM  Result Value Ref Range   Lactic Acid, Venous 2.9 (HH) 0.5 - 1.9 mmol/L  Hepatic function panel     Status: Abnormal   Collection Time: 08/26/22  2:31 PM  Result Value Ref Range   Total Protein 6.7 6.5 - 8.1 g/dL   Albumin 3.0 (L) 3.5 - 5.0 g/dL   AST 102 (H) 15 - 41 U/L   ALT 75 (H) 0 - 44 U/L   Alkaline Phosphatase 169 (H) 38 - 126 U/L   Total Bilirubin 1.2 0.3 - 1.2 mg/dL   Bilirubin, Direct 0.3 (H) 0.0 - 0.2 mg/dL   Indirect Bilirubin 0.9 0.3 - 0.9 mg/dL  Lactic acid, plasma     Status: Abnormal   Collection Time: 08/26/22  6:25 PM  Result Value Ref Range   Lactic Acid, Venous 3.2 (HH) 0.5 - 1.9 mmol/L   Basic Metabolic Panel: Recent Labs  Lab 08/26/22 1305  NA 132*  K 3.1*  CL 93*  CO2 25  GLUCOSE 171*  BUN 10  CREATININE 0.41*  CALCIUM 8.6*   Liver Function Tests: Recent Labs  Lab 08/26/22 1431  AST 147*  ALT 75*  ALKPHOS 169*  BILITOT 1.2  PROT 6.7  ALBUMIN 3.0*   No results for input(s): "LIPASE", "AMYLASE" in the last 168 hours. No results for input(s): "AMMONIA" in the last 168 hours. CBC: Recent Labs  Lab 08/26/22 1305  WBC 22.8*  NEUTROABS 20.4*  HGB 14.4  HCT 41.8  MCV 78.7*  PLT 589*   Cardiac Enzymes: No results for input(s): "CKTOTAL", "CKMB", "CKMBINDEX", "TROPONINIHS" in the last 168 hours.  BNP (last 3 results) No results for input(s): "PROBNP" in the last 8760 hours. CBG: No results for input(s): "GLUCAP" in the last 168 hours.  Radiological Exams on Admission:  CT PELVIS W CONTRAST  Result Date: 08/26/2022 CLINICAL DATA:  Soft tissue infection suspected. Decubitus ulcer, evaluate depth. EXAM: CT PELVIS WITH CONTRAST TECHNIQUE: Multidetector CT imaging of the pelvis was performed using the standard protocol  following the bolus administration of intravenous contrast. RADIATION DOSE REDUCTION: This exam was performed according to the departmental dose-optimization program which includes automated exposure control, adjustment of the mA and/or kV according to patient size and/or use of iterative reconstruction technique. CONTRAST:  75mL OMNIPAQUE IOHEXOL 300 MG/ML  SOLN COMPARISON:  None Available. FINDINGS: Urinary Tract: No urinary bladder wall thickening. Moderate amount of air in the urinary bladder, likely iatrogenic, correlate with history of recent urinary catheterization. Bowel: Moderate amount of stool in the colon suggesting constipation. No bowel wall thickening or acute abnormality. Vascular/Lymphatic: No pathologically enlarged lymph nodes. No significant vascular abnormality seen. Reproductive: No mass or other significant abnormality. Normal uterus. Other: There is marked skin thickening about the lower sacrum with a deep decubitus ulcer measuring at least 1.8 x 2.8 x 3.1 cm. Musculoskeletal: Deep decubitus ulcer about the lower sacrum/coccyx without evidence of cortical erosion or periosteal reaction to suggest osteomyelitis, evaluation of early osteomyelitis is however limited on CT examination. 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 No acute osseous abnormality. IMPRESSION: 1. Marked skin thickening about the lower sacrum/coccyx with a deep decubitus ulcer measuring at least 1.8 x 2.8 x 3.1 cm. No evidence of cortical erosion or periosteal reaction to suggest osteomyelitis, evaluation of early osteomyelitis is however limited on CT examination. 2. Moderate amount of air in the urinary bladder without wall thickening, likely iatrogenic, correlate with history of recent urinary catheterization. Differential also includes urinary tract infection. 3. Moderate amount of stool in the colon suggesting constipation. 4. Degenerate disc disease of the lower lumbar  spine and mild bilateral hip osteoarthritis. Electronically Signed   By: Larose Hires D.O.   On: 08/26/2022 17:01   CT Soft Tissue Neck W Contrast  Result Date: 08/26/2022 CLINICAL DATA:  Soft tissue infection suspected, neck, xray done left cheek and neck swelling EXAM: CT NECK WITH CONTRAST TECHNIQUE: Multidetector CT imaging of the neck was performed using the standard protocol following the bolus administration of intravenous contrast. RADIATION DOSE REDUCTION: This exam was performed according to the departmental dose-optimization program which includes automated exposure control, adjustment of the mA and/or kV according to patient size and/or use of iterative reconstruction technique. CONTRAST:  75mL OMNIPAQUE IOHEXOL 300 MG/ML  SOLN COMPARISON:  None Available. FINDINGS: Pharynx and larynx: Epiglottis is normal in appearance. Bilateral tonsils are normal in appearance. Salivary glands: Bilateral parotid glands are normal in appearance. The right submandibular gland is poorly visualized, slightly posteriorly displaced. Thyroid: Normal. Lymph nodes: None enlarged or abnormal density. Vascular: Negative. Limited intracranial: Negative. Visualized orbits: Negative. Mastoids and visualized paranasal sinuses: Clear. Skeleton: No acute or aggressive process. Upper chest: Ground-glass opacities in the right upper lobe are nonspecific and may be infectious or inflammatory nature. Right-sided chest port in place. Other: There is large rim enhancing 3.6 x 5.1 x 4.6 cm fluid collection centered around the left mandibular body. This likely arises from a periapical lucency  and cortical breakthrough from a left mandibular molar (series 3, image 42). IMPRESSION: 1. Large odontogenic abscess measuring 3.6 x 5.1 x 4.6 cm along the left mandibular body. 2. Ground-glass opacities in the right upper lobe are nonspecific and may be infectious or inflammatory in nature. Electronically Signed   By: Lorenza Cambridge M.D.   On:  08/26/2022 16:46   DG Chest Port 1 View  Result Date: 08/26/2022 CLINICAL DATA:  Questionable sepsis - evaluate for abnormality EXAM: PORTABLE CHEST 1 VIEW COMPARISON:  CXR 11/11/13 FINDINGS: Right-sided chest port with tip near the cavoatrial junction. No pleural effusion. No pneumothorax. No focal airspace opacity. No radiographically apparent displaced rib fractures. Visualized upper abdomen is unremarkable. IMPRESSION: No focal airspace opacity Electronically Signed   By: Lorenza Cambridge M.D.   On: 08/26/2022 15:24    EKG: penidng.   Assessment and Plan: * Abscess Patient has abscess of the around the left mandibular body.  Likely arising from periapical lucency and cortical breakthrough from left mandibular molar.  This is associated with trismus, submandibular space infection, sepsis.  Unfortunately patient lactic acid seems to be trending up.  I will give another liter of bolus LR.  Trend the lactic acid, change the patient to progressive care unit.  S/p vancomycin in the ER.  S/p Decadron in the ER ENT has already been consulted with plan for drainage in the morning.  I will continue patient on Unasyn.  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  This is chronic as per patient's family history.  I am monitoring this clinically, turning position, wound care evaluation.  It did not look grossly infected on my evaluation.  Hepatitis This is seen incidentally.  Exam is limited as patient has an baclofen pump over the right upper quadrant.  Will get an ultrasound and acute hepatitis panel in the morning. F/u inr. ptt  History of DVT (deep vein thrombosis) This is a chronic diagnosis, patient has previously been on Xarelto.  Obviously I will hold Xarelto due to anticipated OR in the morning.  Kindly review patient's medication reconciliation again in the morning at this time medications are pending reconciliation by pharmacy technician.  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  Monitor clinically.  BP (high blood pressure) I will hold all antihypertensives of the patient for this evening, use as needed agents only  Primary cancer of upper outer quadrant of left female breast [facility]) This is a remote diagnosis.  Currently felt to be in remission.  Patient has a port      Advance Care Planning:   Code Status: Full Code   Consults: ENT  Family Communication: daughter and husband at bedside during this encounter.  Severity of Illness: The appropriate patient status for this patient is INPATIENT. Inpatient status is judged to be reasonable and necessary in order to provide the required intensity of service to ensure the patient's safety. The patient's presenting symptoms, physical exam findings, and initial radiographic and laboratory data in the context of their chronic comorbidities is felt to place them at high risk for further clinical deterioration. Furthermore, it is not anticipated that the patient will be medically stable for discharge from the hospital within 2 midnights of admission.   * I certify that at the point of admission it is my clinical judgment that the patient will require inpatient hospital care spanning beyond 2 midnights from the point of  admission due to high intensity of service, high risk for further deterioration and high frequency of surveillance required.*  Author: Nolberto Hanlon, MD 08/26/2022 7:44 PM  For on call review [email or web address].

## 2022-08-26 NOTE — ED Provider Notes (Signed)
[facility] Provider Note    Event Date/Time   First MD Initiated Contact with Patient 08/26/22 1342     (approximate)   History   Facial Swelling   HPI  Lechelle Wrigley Swoveland is a 57 y.o. female with history of multiple sclerosis, breast cancer not on active treatment, DVT on Xarelto presenting to the emergency department for evaluation of facial swelling.  On Saturday, patient began to notice swelling along the left side of her face.  No difficulty breathing, but does report that has been more difficult to swallow and she has had poor p.o. intake in the setting of this.  Does additionally report some worsening weakness from her baseline.  She does have a wound over her sacral area that has been present for a few weeks that a family member who works as a Therapist, music has been attempting to dress.    Physical Exam   Triage Vital Signs: ED Triage Vitals  Encounter Vitals Group     BP 08/26/22 1219 136/83     Systolic BP Percentile --      Diastolic BP Percentile --      Pulse Rate 08/26/22 1219 (!) 113     Resp 08/26/22 1219 18     Temp 08/26/22 1219 97.8 F (36.6 C)     Temp Source 08/26/22 1219 Oral     SpO2 08/26/22 1219 94 %     Weight 08/26/22 1419 126 lb 15.8 oz (57.6 kg)     Height 08/26/22 1419 5\' 2"  (1.575 m)     Head Circumference --      Peak Flow --      Pain Score 08/26/22 1219 6     Pain Loc --      Pain Education --      Exclude from Growth Chart --     Most recent vital signs: Vitals:   08/26/22 1637 08/26/22 1757  BP: 130/80 130/82  Pulse: (!) 110 (!) 103  Resp: 18 18  Temp: 98 F (36.7 C)   SpO2: 95% 100%     General: Awake, interactive  HEENT: There is significant swelling over the left cheek extending to the left mandibular and submandibular region with a small amount of associated fluctuance, not significantly erythematous or warm to touch.  Mucous membranes are very dry with some yellow appearing material in the  oropharynx and some dental decay.  There is significant tenderness to palpation along the mandibular region CV:  Tachycardic with regular rhythm, good peripheral perfusion Resp:  Lungs clear, unlabored respirations.  Abd:  Soft, nondistended Neuro:  Symmetric facial movement, limited but fluent speech Skin:  There is a sacral decubitus ulcer extending deep into the soft tissue with a small amount of surrounding yellow drainage   ED Results / Procedures / Treatments   Labs (all labs ordered are listed, but only abnormal results are displayed) Labs Reviewed  CBC WITH DIFFERENTIAL/PLATELET - Abnormal; Notable for the following components:      Result Value   WBC 22.8 (*)    RBC 5.31 (*)    MCV 78.7 (*)    RDW 15.8 (*)    Platelets 589 (*)    Neutro Abs 20.4 (*)    Lymphs Abs 0.6 (*)    Monocytes Absolute 1.3 (*)    Abs Immature Granulocytes 0.30 (*)    All other components within normal limits  BASIC METABOLIC PANEL - Abnormal; Notable for the following components:  Sodium 132 (*)    Potassium 3.1 (*)    Chloride 93 (*)    Glucose, Bld 171 (*)    Creatinine, Ser 0.41 (*)    Calcium 8.6 (*)    All other components within normal limits  LACTIC ACID, PLASMA - Abnormal; Notable for the following components:   Lactic Acid, Venous 2.9 (*)    All other components within normal limits  HEPATIC FUNCTION PANEL - Abnormal; Notable for the following components:   Albumin 3.0 (*)    AST 147 (*)    ALT 75 (*)    Alkaline Phosphatase 169 (*)    Bilirubin, Direct 0.3 (*)    All other components within normal limits  RESP PANEL BY RT-PCR (RSV, FLU A&B, COVID)  RVPGX2  CULTURE, BLOOD (ROUTINE X 2)  CULTURE, BLOOD (ROUTINE X 2)  LACTIC ACID, PLASMA  URINALYSIS, W/ REFLEX TO CULTURE (INFECTION SUSPECTED)     EKG EKG independently reviewed interpreted by myself (ER attending) demonstrates:    RADIOLOGY Imaging independently reviewed and interpreted by myself demonstrates:  CT soft  tissue neck demonstrates a large abscess that per radiology is likely odontogenic in origin CT pelvis demonstrates sacral decubitus ulcer, radiology does not note any obvious signs of osteomyelitis.  PROCEDURES:  Critical Care performed: Yes, see critical care procedure note(s)  CRITICAL CARE Performed by: Trinna Post   Total critical care time: 40 minutes  Critical care time was exclusive of separately billable procedures and treating other patients.  Critical care was necessary to treat or prevent imminent or life-threatening deterioration.  Critical care was time spent personally by me on the following activities: development of treatment plan with patient and/or surrogate as well as nursing, discussions with consultants, evaluation of patient's response to treatment, examination of patient, obtaining history from patient or surrogate, ordering and performing treatments and interventions, ordering and review of laboratory studies, ordering and review of radiographic studies, pulse oximetry and re-evaluation of patient's condition.   Procedures   MEDICATIONS ORDERED IN ED: Medications  Ampicillin-Sulbactam (UNASYN) 3 g in sodium chloride 0.9 % 100 mL IVPB (has no administration in time range)  sodium chloride 0.9 % bolus 1,000 mL (has no administration in time range)  dexamethasone (DECADRON) injection 10 mg (has no administration in time range)  vancomycin (VANCOCIN) IVPB 1000 mg/200 mL premix (0 mg Intravenous Stopped 08/26/22 1757)  cefTRIAXone (ROCEPHIN) 2 g in sodium chloride 0.9 % 100 mL IVPB (0 g Intravenous Stopped 08/26/22 1708)  sodium chloride 0.9 % bolus 1,000 mL (1,000 mLs Intravenous New Bag/Given 08/26/22 1640)  iohexol (OMNIPAQUE) 300 MG/ML solution 100 mL (75 mLs Intravenous Contrast Given 08/26/22 1606)     IMPRESSION / MDM / ASSESSMENT AND PLAN / ED COURSE  I reviewed the triage vital signs and the nursing notes.  Differential diagnosis includes, but is not limited  to, abscess, parotitis, lymphadenitis, Ludwig's angina, questionable sepsis related to soft tissue infection of the neck versus decubitus ulcer  Patient's presentation is most consistent with acute presentation with potential threat to life or bodily function.  57 year old female presenting to the emergency department for evaluation of neck swelling.  Obvious swelling along the mandibular region on exam.  Her lab work demonstrates significant leukocytosis WC of 22.8 and is tachycardic on presentation.  She also has a infected appearing decubitus ulcer on exam.  In the setting of this, sepsis orders were initiated with empiric ceftriaxone and vancomycin.  CT scan did demonstrate a large odontogenic abscess.  Case  was reviewed with Dr. Jenne Campus.  He did discuss transfer to a facility that did have dentistry capabilities, but [facility] does not have inpatient dentistry or oral surgery coverage currently.  I did discuss transfer to [facility], but patient would like to remain at our hospital.  Dr. Jenne Campus did review the patient's imaging and speak with the patient's family.  He does think that the patient can be appropriately admitted here to the hospitalist service with IV antibiotics.  He will plan for incision and drainage in the operating room tomorrow potentially with consultation of OMFS if available.  He did recommend addition of Unasyn to patient's antibiotic regimen as well as scheduled Decadron which have been ordered.  Discussed with patient who is comfortable this plan.  Will reach out to hospitalist team.  Case discussed with Dr. Maryjean Ka. He will evaluate the patient for anticipated admission.       FINAL CLINICAL IMPRESSION(S) / ED DIAGNOSES   Final diagnoses:  Severe sepsis (HCC)  Dental abscess  Facial swelling  Pressure injury of skin of sacral region, unspecified injury stage     Rx / DC Orders   ED Discharge Orders     None        Note:  This document was prepared using Dragon  voice recognition software and may include unintentional dictation errors.   Trinna Post, MD 08/26/22 [phone]

## 2022-08-26 NOTE — ED Notes (Signed)
See triage note  Presents with family  States she developed sore throat and ear pain this past weekend  Then noticed some swelling to left side of face  States it hurts to swallow  No fever

## 2022-08-26 NOTE — Progress Notes (Signed)
Pharmacy Antibiotic Note  Rachel Hall is a 57 y.o. female admitted on 08/26/2022 with  Periapical tooth abscess .  Pharmacy has been consulted for Unasyn dosing.  Plan: Unasyn 3g IV q6h  Height: 5\' 2"  (157.5 cm) Weight: 57.6 kg (126 lb 15.8 oz) IBW/kg (Calculated) : 50.1  Temp (24hrs), Avg:97.9 F (36.6 C), Min:97.8 F (36.6 C), Max:98 F (36.7 C)  Recent Labs  Lab 08/26/22 1305 08/26/22 1431 08/26/22 1825  WBC 22.8*  --   --   CREATININE 0.41*  --   --   LATICACIDVEN  --  2.9* 3.2*    Estimated Creatinine Clearance: 61.4 mL/min (A) (by C-G formula based on SCr of 0.41 mg/dL (L)).    No Known Allergies  Antimicrobials this admission: Unasyn 7/16 >>   Dose adjustments this admission:  Microbiology results:  Thank you for allowing pharmacy to be a part of this patient's care.  Clovia Cuff, PharmD, BCPS 08/26/2022 7:09 PM

## 2022-08-27 ENCOUNTER — Inpatient Hospital Stay: Payer: Medicare Other | Admitting: Anesthesiology

## 2022-08-27 ENCOUNTER — Other Ambulatory Visit: Payer: Self-pay

## 2022-08-27 ENCOUNTER — Encounter
Admission: EM | Disposition: A | Discharge status: Home Health | Payer: Self-pay | Source: Home / Self Care | Attending: Internal Medicine

## 2022-08-27 ENCOUNTER — Encounter: Payer: Self-pay | Admitting: Internal Medicine

## 2022-08-27 DIAGNOSIS — E876 Hypokalemia: Secondary | ICD-10-CM

## 2022-08-27 DIAGNOSIS — M62838 Other muscle spasm: Secondary | ICD-10-CM

## 2022-08-27 DIAGNOSIS — E872 Acidosis, unspecified: Secondary | ICD-10-CM

## 2022-08-27 DIAGNOSIS — L0291 Cutaneous abscess, unspecified: Secondary | ICD-10-CM | POA: Diagnosis not present

## 2022-08-27 DIAGNOSIS — R7989 Other specified abnormal findings of blood chemistry: Secondary | ICD-10-CM

## 2022-08-27 DIAGNOSIS — E871 Hypo-osmolality and hyponatremia: Secondary | ICD-10-CM

## 2022-08-27 DIAGNOSIS — Z86718 Personal history of other venous thrombosis and embolism: Secondary | ICD-10-CM

## 2022-08-27 DIAGNOSIS — C50412 Malignant neoplasm of upper-outer quadrant of left female breast: Secondary | ICD-10-CM

## 2022-08-27 HISTORY — PX: INCISION AND DRAINAGE ABSCESS: SHX5864

## 2022-08-27 LAB — CBG MONITORING, ED: Glucose-Capillary: 158 mg/dL — ABNORMAL HIGH (ref 70–99)

## 2022-08-27 LAB — HEPATITIS PANEL, ACUTE
HCV Ab: NONREACTIVE
Hep A IgM: NONREACTIVE
Hep B C IgM: NONREACTIVE
Hepatitis B Surface Ag: NONREACTIVE

## 2022-08-27 LAB — PROTIME-INR
INR: 1.3 — ABNORMAL HIGH (ref 0.8–1.2)
Prothrombin Time: 16.4 seconds — ABNORMAL HIGH (ref 11.4–15.2)

## 2022-08-27 LAB — HIV ANTIBODY (ROUTINE TESTING W REFLEX): HIV Screen 4th Generation wRfx: NONREACTIVE

## 2022-08-27 LAB — APTT: aPTT: 32 seconds (ref 24–36)

## 2022-08-27 SURGERY — INCISION AND DRAINAGE, ABSCESS
Anesthesia: General

## 2022-08-27 MED ORDER — BISACODYL 5 MG PO TBEC
5.0000 mg | DELAYED_RELEASE_TABLET | Freq: Once | ORAL | Status: AC
  Administered 2022-08-27: 5 mg via ORAL
  Filled 2022-08-27: qty 1

## 2022-08-27 MED ORDER — BOOST / RESOURCE BREEZE PO LIQD CUSTOM
1.0000 | Freq: Three times a day (TID) | ORAL | Status: DC
  Administered 2022-08-27 – 2022-08-29 (×5): 1 via ORAL

## 2022-08-27 MED ORDER — FENTANYL CITRATE (PF) 100 MCG/2ML IJ SOLN
INTRAMUSCULAR | Status: AC
  Filled 2022-08-27: qty 2

## 2022-08-27 MED ORDER — DEXAMETHASONE SODIUM PHOSPHATE 10 MG/ML IJ SOLN
INTRAMUSCULAR | Status: DC | PRN
  Administered 2022-08-27: 10 mg via INTRAVENOUS

## 2022-08-27 MED ORDER — DEXAMETHASONE SODIUM PHOSPHATE 10 MG/ML IJ SOLN
10.0000 mg | Freq: Two times a day (BID) | INTRAMUSCULAR | Status: DC
  Administered 2022-08-27: 10 mg via INTRAVENOUS
  Filled 2022-08-27: qty 1

## 2022-08-27 MED ORDER — OXYCODONE HCL 5 MG PO TABS: 5.0000 mg | ORAL_TABLET | Freq: Once | ORAL | Status: DC | PRN

## 2022-08-27 MED ORDER — SUCCINYLCHOLINE CHLORIDE 200 MG/10ML IV SOSY
PREFILLED_SYRINGE | INTRAVENOUS | Status: DC | PRN
  Administered 2022-08-27: 120 mg via INTRAVENOUS

## 2022-08-27 MED ORDER — VANCOMYCIN HCL 1250 MG/250ML IV SOLN
1250.0000 mg | INTRAVENOUS | Status: DC
  Administered 2022-08-27 – 2022-08-28 (×2): 1250 mg via INTRAVENOUS
  Filled 2022-08-27 (×2): qty 250

## 2022-08-27 MED ORDER — PROPOFOL 10 MG/ML IV BOLUS
INTRAVENOUS | Status: AC
  Filled 2022-08-27: qty 20

## 2022-08-27 MED ORDER — LIDOCAINE-EPINEPHRINE (PF) 1 %-1:200000 IJ SOLN
INTRAMUSCULAR | Status: DC | PRN
  Administered 2022-08-27: 5 mL

## 2022-08-27 MED ORDER — CLINDAMYCIN PHOSPHATE 600 MG/50ML IV SOLN
INTRAVENOUS | Status: AC
  Filled 2022-08-27: qty 50

## 2022-08-27 MED ORDER — LACTATED RINGERS IV SOLN: INTRAVENOUS | Status: DC | PRN

## 2022-08-27 MED ORDER — MIDAZOLAM HCL 2 MG/2ML IJ SOLN
INTRAMUSCULAR | Status: AC
  Filled 2022-08-27: qty 2

## 2022-08-27 MED ORDER — PROPOFOL 10 MG/ML IV BOLUS
INTRAVENOUS | Status: DC | PRN
  Administered 2022-08-27: 100 mg via INTRAVENOUS

## 2022-08-27 MED ORDER — MEDIHONEY WOUND/BURN DRESSING EX PSTE
1.0000 | PASTE | Freq: Every day | CUTANEOUS | Status: DC
  Administered 2022-08-27 – 2022-08-29 (×3): 1 via TOPICAL
  Filled 2022-08-27 (×2): qty 44

## 2022-08-27 MED ORDER — CHLORHEXIDINE GLUCONATE 0.12 % MT SOLN
15.0000 mL | Freq: Four times a day (QID) | OROMUCOSAL | Status: DC
  Administered 2022-08-27 – 2022-08-29 (×7): 15 mL via OROMUCOSAL
  Filled 2022-08-27 (×7): qty 15

## 2022-08-27 MED ORDER — OXYCODONE HCL 5 MG/5ML PO SOLN: 5.0000 mg | Freq: Once | ORAL | Status: DC | PRN

## 2022-08-27 MED ORDER — 0.9 % SODIUM CHLORIDE (POUR BTL) OPTIME
TOPICAL | Status: DC | PRN
  Administered 2022-08-27: 500 mL

## 2022-08-27 MED ORDER — CLINDAMYCIN PHOSPHATE 900 MG/6ML IJ SOLN
INTRAMUSCULAR | Status: DC | PRN
  Administered 2022-08-27: 600 mg via INTRAMUSCULAR

## 2022-08-27 MED ORDER — MIDAZOLAM HCL 2 MG/2ML IJ SOLN
INTRAMUSCULAR | Status: DC | PRN
  Administered 2022-08-27: 2 mg via INTRAVENOUS

## 2022-08-27 MED ORDER — FAMOTIDINE IN NACL 20-0.9 MG/50ML-% IV SOLN
20.0000 mg | Freq: Once | INTRAVENOUS | Status: AC
  Administered 2022-08-27: 20 mg via INTRAVENOUS
  Filled 2022-08-27: qty 50

## 2022-08-27 MED ORDER — PHENYLEPHRINE 80 MCG/ML (10ML) SYRINGE FOR IV PUSH (FOR BLOOD PRESSURE SUPPORT)
PREFILLED_SYRINGE | INTRAVENOUS | Status: DC | PRN
  Administered 2022-08-27 (×2): 80 ug via INTRAVENOUS

## 2022-08-27 MED ORDER — LIDOCAINE HCL (CARDIAC) PF 100 MG/5ML IV SOSY
PREFILLED_SYRINGE | INTRAVENOUS | Status: DC | PRN
  Administered 2022-08-27: 60 mg via INTRAVENOUS

## 2022-08-27 MED ORDER — LIDOCAINE-EPINEPHRINE 1 %-1:100000 IJ SOLN
INTRAMUSCULAR | Status: AC
  Filled 2022-08-27: qty 1

## 2022-08-27 MED ORDER — FENTANYL CITRATE (PF) 100 MCG/2ML IJ SOLN
INTRAMUSCULAR | Status: DC | PRN
  Administered 2022-08-27: 50 ug via INTRAVENOUS

## 2022-08-27 MED ORDER — FENTANYL CITRATE (PF) 100 MCG/2ML IJ SOLN: 25.0000 ug | INTRAMUSCULAR | Status: DC | PRN

## 2022-08-27 MED ORDER — ONDANSETRON HCL 4 MG/2ML IJ SOLN
INTRAMUSCULAR | Status: DC | PRN
Start: 2022-08-27 — End: 2022-08-27
  Administered 2022-08-27: 4 mg via INTRAVENOUS

## 2022-08-27 SURGICAL SUPPLY — 23 items
BLADE SURG 15 STRL LF DISP TIS (BLADE) ×1 IMPLANT
BLADE SURG 15 STRL SS (BLADE) ×1
CORD BIP STRL DISP 12FT (MISCELLANEOUS) ×1 IMPLANT
ELECT CAUTERY BLADE TIP 2.5 (TIP) ×1
ELECT REM PT RETURN 9FT ADLT (ELECTROSURGICAL) ×1
ELECTRODE CAUTERY BLDE TIP 2.5 (TIP) ×1 IMPLANT
ELECTRODE REM PT RTRN 9FT ADLT (ELECTROSURGICAL) ×1 IMPLANT
FORCEPS JEWEL BIP 4-3/4 STR (INSTRUMENTS) ×1 IMPLANT
GAUZE PACKING IODOFORM 1/2INX (GAUZE/BANDAGES/DRESSINGS) IMPLANT
GAUZE SPONGE 4X4 12PLY STRL (GAUZE/BANDAGES/DRESSINGS) IMPLANT
GLOVE BIO SURGEON STRL SZ7.5 (GLOVE) ×1 IMPLANT
GOWN STRL REUS W/ TWL LRG LVL3 (GOWN DISPOSABLE) ×2 IMPLANT
GOWN STRL REUS W/TWL LRG LVL3 (GOWN DISPOSABLE) ×2
LABEL OR SOLS (LABEL) ×1 IMPLANT
MANIFOLD NEPTUNE II (INSTRUMENTS) ×1 IMPLANT
NS IRRIG 500ML POUR BTL (IV SOLUTION) ×1 IMPLANT
PACK HEAD/NECK (MISCELLANEOUS) ×1 IMPLANT
SOL PREP PVP 2OZ (MISCELLANEOUS) ×1
SOLUTION PREP PVP 2OZ (MISCELLANEOUS) ×1 IMPLANT
SUCTION TUBE FRAZIER 10FR DISP (SUCTIONS) ×1 IMPLANT
SUT VIC AB 4-0 RB1 18 (SUTURE) ×1 IMPLANT
TRAP FLUID SMOKE EVACUATOR (MISCELLANEOUS) ×1 IMPLANT
WATER STERILE IRR 500ML POUR (IV SOLUTION) ×1 IMPLANT

## 2022-08-27 NOTE — Assessment & Plan Note (Signed)
Improved with fluids 

## 2022-08-27 NOTE — Assessment & Plan Note (Signed)
Could be secondary to infection.  Continue to monitor.  Hepatitis profile was ordered by admitting physician.

## 2022-08-27 NOTE — Transfer of Care (Signed)
Immediate Anesthesia Transfer of Care Note  Patient: Rachel Hall  Procedure(s) Performed: INCISION AND DRAINAGE ABSCESS  Patient Location: PACU  Anesthesia Type:General  Level of Consciousness: drowsy  Airway & Oxygen Therapy: Patient Spontanous Breathing and Patient connected to face mask oxygen  Post-op Assessment: Report given to RN and Post -op Vital signs reviewed and stable  Post vital signs: Reviewed and stable  Last Vitals:  Vitals Value Taken Time  BP 109/77   Temp    Pulse 92   Resp 12   SpO2 100     Last Pain:  Vitals:   08/27/22 0658  TempSrc: Oral  PainSc: 0-No pain         Complications: No notable events documented.

## 2022-08-27 NOTE — Op Note (Signed)
08/27/2022  7:54 AM    Adah Salvage  [id number]   Pre-Op Dx: neck abscess  Post-op Dx: SAME  Proc: Incision and drainage left neck abscess  Surg:  Davina Poke  Anes:  GOT  EBL: Less than 10 cc  Comp: None  Findings: Large abscess cavity adjacent to, inferior to and medial to the angle of the mandible.  Approximately 30 cc of pus in the cavity.  Procedure: Zailee was identified in the holding area taken the operating placed in supine position.  After general trach anesthesia the table was turned 90 degrees.  There was a large tense abscess pocket just inferior to the angle of the mandible.  Incision line was marked over this a local anesthetic of 1% lidocaine with 1000 use epinephrine was used to inject over the incision line a total of 2 cc was used.  The left neck was then prepped and draped sterilely.  A 10 cc syringe with 18-gauge needle was used to probe the abscess cavity pus was immediately identified.  The needle was then removed a 15 blade was used to make an incision through the skin.  A curved hemostat was then used to probe down through and through the platysma muscle and immediately the large pus pocket was entered.  Immediately pus was expressed.  The hemostats were opened widely opening into the pocket several loculations were divided and opened.  There was approximately 30 cc of pus which was expressed from the wound.  This was sent for routine culture.  The wound was probed it went down lateral inferior and medial to the mandible which was the most likely source of the abscess.  With all the pus drained the wound was then copiously irrigated with a saline solution instilled with 600 mg of clindamycin.  With the wound copiously irrigated iodoform packing was then used to pack the abscess cavity.  This gauze was then brought out of the wound inferiorly and taped to the skin.  A dressing was then applied.  The patient was then awakened in the operating room taken to cover  in stable condition.  Culture: Left neck abscess  Dispo:   Good  Plan: Continue administration of IV antibiotics in the hospital will continue to follow  Davina Poke  08/27/2022 7:54 AM

## 2022-08-27 NOTE — Progress Notes (Signed)
Progress Note   Patient: Rachel Hall NWG:[id number] DOB: 1965/09/07 DOA: 08/26/2022     1 DOS: the patient was seen and examined on 08/27/2022   Brief Hall course: 57 y.o. female with medical history significant of multiple sclerosis.  Patient at baseline has complete palsy of the left lower extremity, almost complete palsy of the right lower extremity, marked palsy of the left upper extremity, some preservation of antigravity strength in the right upper extremity.  She also has chronic cognitive deficit manifesting as memory loss, neglect of symptoms, and generally poor appetite.  Patient has been bedbound for approximately 3 months.  Patient also has prior known sacral decubitus ulcer.  That has been managed by nurse who is in the family.  Per family, husband and daughter at the bedside, the wound has actually been getting better over this period of time.   Patient was in her usual state of health till about 4 days ago when patient had even a worse appetite and refusing to eat and drink much.  There is no report of fever or rigors.  Subsequently patient was noted over the last 48 hours to have a swelling developing just above the left angle of the jaw as well as just inferior to the angle of the jaw.  Subsequently patient started reporting pain at the site with attempts to feed her and patient was noted to have trouble opening her mouth.  Patient is brought to the ER.  7/17.  Patient brought to the operating room by Dr. Jenne Hall for drainage of abscess.  I added vancomycin to the Unasyn until cultures are back.  Assessment and Plan: * Abscess With leukocytosis.  Patient has abscess of the around the left mandibular body.  Patient brought to the operating room by Dr. Jenne Hall for drainage of abscess today.  Continue Unasyn.  Added vancomycin until cultures are back.  Sepsis ruled out.  Lactic acidosis Improved with fluids  Muscle spasticity With history of MS.  Patient has a baclofen  pump in situ, this is apparently still being used.  Monitor clinically.  Pressure ulcer Chronic stage III as per wound care nurse.  Present on admission.  Elevated liver function tests Could be secondary to infection.  Continue to monitor.  Hepatitis profile was ordered by admitting physician.  Hypokalemia Replaced  Hyponatremia Sodium a few points less than the normal range.  History of DVT (deep vein thrombosis) Held Xarelto for operating room.  Primary cancer of upper outer quadrant of left female breast Rachel Hall) This is a remote diagnosis.  Currently felt to be in remission.  Patient has a port        Subjective: Patient seen in the PACU.  No complaints of chest pain or shortness of breath.  Just had drainage of abscess this morning.  Admitted with abscess.  Physical Exam: Vitals:   08/27/22 1000 08/27/22 1015 08/27/22 1025 08/27/22 1100  BP: 117/77 118/75  124/81  Pulse: 89 88 90 93  Resp: 15 15 15 15   Temp: (!) 97 F (36.1 C)   (!) 97 F (36.1 C)  TempSrc:      SpO2: 96% 95% 95% 97%  Weight:      Height:       Physical Exam HENT:     Head: Normocephalic.     Mouth/Throat:     Pharynx: No oropharyngeal exudate.  Eyes:     General: Lids are normal.     Conjunctiva/sclera: Conjunctivae normal.  Cardiovascular:  Rate and Rhythm: Normal rate and regular rhythm.     Heart sounds: Normal heart sounds, S1 normal and S2 normal.  Pulmonary:     Breath sounds: No decreased breath sounds, wheezing, rhonchi or rales.  Abdominal:     Palpations: Abdomen is soft.     Tenderness: There is no abdominal tenderness.  Musculoskeletal:     Right lower leg: No swelling.     Left lower leg: No swelling.  Skin:    General: Skin is warm.     Findings: No rash.     Comments: Bandage over left neck.  Neurological:     Mental Status: She is alert.     Data Reviewed: HIV test negative, white blood cell count 22.9, hemoglobin 11.8, platelet count 435, lactic acid now down  to 1.9, creatinine 0.38, AST 147, ALT 75, potassium 3.1, sodium 132  Family Communication: Spoke with patient's mother in the waiting room  Disposition: Status is: Inpatient Remains inpatient appropriate because: Postoperative day 0 for abscess drainage.  White count still high.  Continue IV antibiotics.  Planned Discharge Destination: Home    Time spent: 28 minutes  Author: Alford Highland, MD 08/27/2022 12:27 PM  For on call review [email or web address].

## 2022-08-27 NOTE — Consult Note (Signed)
Pharmacy Antibiotic Note  Rachel Hall is a 57 y.o. female with medical history including MS, history of breast cancer, HTN, depression, history of DVT, osteoporosis admitted on 08/26/2022 with  neck abscess s/p I&D .  Pharmacy has been consulted for vancomycin and Unasyn dosing.  Plan:  Unasyn 3 g IV q6h  Vancomycin 1.25 g IV q24h --Calculated AUC: 545, Cmin: 11.8 --Daily Scr per protocol --Levels at steady state or as clinically indicated  Height: 5\' 2"  (157.5 cm) Weight: 57.6 kg (126 lb 15.8 oz) IBW/kg (Calculated) : 50.1  Temp (24hrs), Avg:97.3 F (36.3 C), Min:97 F (36.1 C), Max:98 F (36.7 C)  Recent Labs  Lab 08/26/22 1305 08/26/22 1431 08/26/22 1825 08/26/22 2155  WBC 22.8*  --   --  22.9*  CREATININE 0.41*  --   --  0.38*  LATICACIDVEN  --  2.9* 3.2* 1.9    Estimated Creatinine Clearance: 61.4 mL/min (A) (by C-G formula based on SCr of 0.38 mg/dL (L)).    No Known Allergies  Antimicrobials this admission: Ceftriaxone 7/16 x 1 Unasyn 7/16 >>  Vancomycin 7/16 x 1, 7/17 >>  Clindamycin 7/17 x 1  Dose adjustments this admission: N/A  Microbiology results: 7/16 BCx: NGTD 7/17 Abscess Cx: pending  Thank you for allowing pharmacy to be a part of this patient's care.  Tressie Ellis 08/27/2022 12:25 PM

## 2022-08-27 NOTE — Anesthesia Procedure Notes (Addendum)
Procedure Name: Intubation Date/Time: 08/27/2022 7:30 AM  Performed by: Joanette Gula, Kirat Mezquita, CRNAPre-anesthesia Checklist: Patient identified, Emergency Drugs available, Suction available and Patient being monitored Patient Re-evaluated:Patient Re-evaluated prior to induction Oxygen Delivery Method: Circle system utilized Preoxygenation: Pre-oxygenation with 100% oxygen Induction Type: IV induction and Rapid sequence Laryngoscope Size: McGraph and 3 Grade View: Grade I Tube type: Oral Tube size: 6.5 mm Number of attempts: 1 Airway Equipment and Method: Stylet Placement Confirmation: ETT inserted through vocal cords under direct vision, positive ETCO2 and breath sounds checked- equal and bilateral Secured at: 19 cm Tube secured with: Tape Dental Injury: Teeth and Oropharynx as per pre-operative assessment

## 2022-08-27 NOTE — Consult Note (Addendum)
WOC Nurse Consult Note: Reason for Consult: Consult requested for sacrum wound.  Performed remotely after review of progress notes.   Wound type:Chronic Stage 4 pressure injury Pressure Injury POA: Yes Measurement: According to progress notes; 7X5cm with undermining and mod amt tan drainage.  Dressing procedure/placement/frequency: Topical treatment orders provided for bedside nurses to perform as follows: Apply Medihoney to sacrum wound Q day, then cover with foam dressing.  Change foam dressing Q 3 days or PRN soiling. Pt is currently in the OR and I will plan to assess the wound appearance tomorrow and adjust plan of care if indicated at that time.  Thank-you,  Cammie Mcgee MSN, RN, CWOCN, [place], CNS [phone]

## 2022-08-27 NOTE — Assessment & Plan Note (Addendum)
Replaced. °

## 2022-08-27 NOTE — Anesthesia Postprocedure Evaluation (Signed)
Anesthesia Post Note  Patient: Rachel Hall  Procedure(s) Performed: INCISION AND DRAINAGE ABSCESS  Patient location during evaluation: PACU Anesthesia Type: General Level of consciousness: awake and alert Pain management: pain level controlled Vital Signs Assessment: post-procedure vital signs reviewed and stable Respiratory status: spontaneous breathing, nonlabored ventilation, respiratory function stable and patient connected to nasal cannula oxygen Cardiovascular status: blood pressure returned to baseline and stable Postop Assessment: no apparent nausea or vomiting Anesthetic complications: no  No notable events documented.   Last Vitals:  Vitals:   08/27/22 0815 08/27/22 0830  BP: 115/77 117/76  Pulse: 94 93  Resp: 19 18  Temp:  (!) 36.2 C  SpO2: 94% 93%    Last Pain:  Vitals:   08/27/22 0815  TempSrc:   PainSc: Asleep                 Stephanie Coup

## 2022-08-27 NOTE — Anesthesia Preprocedure Evaluation (Addendum)
Anesthesia Evaluation  Patient identified by MRN, date of birth, ID band Patient confused    Reviewed: Allergy & Precautions, NPO status , Patient's Chart, lab work & pertinent test results, reviewed documented beta blocker date and time   Airway Mallampati: IV  TM Distance: >3 FB Neck ROM: full  Mouth opening: Limited Mouth Opening  Dental  (+) Chipped, Dental Advidsory Given   Pulmonary neg pulmonary ROS, neg COPD   Pulmonary exam normal        Cardiovascular hypertension, Pt. on medications + DVT  Normal cardiovascular exam     Neuro/Psych  PSYCHIATRIC DISORDERS  Depression     Neuromuscular disease    GI/Hepatic negative GI ROS, Neg liver ROS,,,  Endo/Other  negative endocrine ROS    Renal/GU      Musculoskeletal   Abdominal   Peds  Hematology negative hematology ROS (+)   Anesthesia Other Findings Past Medical History: 2015: Breast cancer (HCC)     Comment:  left breast cancer No date: Hypertension No date: MS (multiple sclerosis) (HCC)     Comment:  unable to stand on her own No date: Personal history of chemotherapy No date: Personal history of radiation therapy No date: Post-menopausal No date: Presence of implanted infusion pump     Comment:  right abdomen No date: Status post chemotherapy     Comment:  left breast cancer No date: Status post radiation therapy     Comment:  breast cancer  Past Surgical History: No date: BREAST BIOPSY; Left     Comment:  positive 2015 2015: BREAST LUMPECTOMY; Left     Comment:  invasive, f/u radiation.  03/24/2018: COLONOSCOPY WITH PROPOFOL; N/A     Comment:  Procedure: COLONOSCOPY WITH PROPOFOL;  Surgeon: Toledo,               Boykin Nearing, MD;  Location: [facility] ENDOSCOPY;  Service:               Gastroenterology;  Laterality: N/A; 03/24/2018: ESOPHAGOGASTRODUODENOSCOPY (EGD) WITH PROPOFOL; N/A     Comment:  Procedure: ESOPHAGOGASTRODUODENOSCOPY (EGD) WITH                PROPOFOL;  Surgeon: Toledo, Boykin Nearing, MD;  Location:               [facility] ENDOSCOPY;  Service: Gastroenterology;  Laterality:               N/A;  BMI    Body Mass Index: 23.23 kg/m      Reproductive/Obstetrics negative OB ROS                             Anesthesia Physical Anesthesia Plan  ASA: 2 and emergent  Anesthesia Plan: General ETT   Post-op Pain Management:    Induction: Intravenous and Rapid sequence  PONV Risk Score and Plan: 3 and Ondansetron, Dexamethasone, Midazolam and Treatment may vary due to age or medical condition  Airway Management Planned: Oral ETT  Additional Equipment:   Intra-op Plan:   Post-operative Plan: Extubation in OR  Informed Consent: I have reviewed the patients History and Physical, chart, labs and discussed the procedure including the risks, benefits and alternatives for the proposed anesthesia with the patient or authorized representative who has indicated his/her understanding and acceptance.     Dental Advisory Given and Consent reviewed with POA  Plan Discussed with: Anesthesiologist, CRNA and Surgeon  Anesthesia Plan Comments: (Patient's mother consented  for risks of anesthesia including but not limited to:  - adverse reactions to medications - damage to eyes, teeth, lips or other oral mucosa - nerve damage due to positioning  - sore throat or hoarseness - Damage to heart, brain, nerves, lungs, other parts of body or loss of life  Patient's mother voiced understanding.)       Anesthesia Quick Evaluation

## 2022-08-27 NOTE — ED Notes (Signed)
Report given to OR team.

## 2022-08-27 NOTE — Assessment & Plan Note (Addendum)
Last sodium normal range at 139

## 2022-08-27 NOTE — Hospital Course (Addendum)
57 y.o. female with medical history significant of multiple sclerosis.  Patient at baseline has complete palsy of the left lower extremity, almost complete palsy of the right lower extremity, marked palsy of the left upper extremity, some preservation of antigravity strength in the right upper extremity.  She also has chronic cognitive deficit manifesting as memory loss, neglect of symptoms, and generally poor appetite.  Patient has been bedbound for approximately 3 months.  Patient also has prior known sacral decubitus ulcer.  That has been managed by nurse who is in the family.  Per family, husband and daughter at the bedside, the wound has actually been getting better over this period of time.   Patient was in her usual state of health till about 4 days ago when patient had even a worse appetite and refusing to eat and drink much.  There is no report of fever or rigors.  Subsequently patient was noted over the last 48 hours to have a swelling developing just above the left angle of the jaw as well as just inferior to the angle of the jaw.  Subsequently patient started reporting pain at the site with attempts to feed her and patient was noted to have trouble opening her mouth.  Patient is brought to the ER.  7/17.  Patient brought to the operating room by Dr. Jenne Campus for drainage of abscess.  I added vancomycin to the Unasyn until cultures are back. 7/18.  Patient was less responsive and needed to sternal rub to wake up.  When I came into the room she was answering questions appropriately.  She has no recollection of being in the hospital.  Patient has baseline left-sided weakness. 7/19.  Streptococcus intermedius growing out of culture sensitive to penicillin.  Dr. Jenne Campus recommended Augmentin for total of 2 weeks.

## 2022-08-28 DIAGNOSIS — M62838 Other muscle spasm: Secondary | ICD-10-CM | POA: Diagnosis not present

## 2022-08-28 DIAGNOSIS — K5900 Constipation, unspecified: Secondary | ICD-10-CM

## 2022-08-28 DIAGNOSIS — R404 Transient alteration of awareness: Secondary | ICD-10-CM

## 2022-08-28 DIAGNOSIS — L0291 Cutaneous abscess, unspecified: Secondary | ICD-10-CM | POA: Diagnosis not present

## 2022-08-28 DIAGNOSIS — E872 Acidosis, unspecified: Secondary | ICD-10-CM | POA: Diagnosis not present

## 2022-08-28 DIAGNOSIS — L89154 Pressure ulcer of sacral region, stage 4: Secondary | ICD-10-CM

## 2022-08-28 LAB — CBC
HCT: 32 % — ABNORMAL LOW (ref 36.0–46.0)
Hemoglobin: 10.6 g/dL — ABNORMAL LOW (ref 12.0–15.0)
MCH: 27 pg (ref 26.0–34.0)
MCHC: 33.1 g/dL (ref 30.0–36.0)
MCV: 81.4 fL (ref 80.0–100.0)
Platelets: 417 10*3/uL — ABNORMAL HIGH (ref 150–400)
RBC: 3.93 MIL/uL (ref 3.87–5.11)
RDW: 16 % — ABNORMAL HIGH (ref 11.5–15.5)
WBC: 12.8 10*3/uL — ABNORMAL HIGH (ref 4.0–10.5)
nRBC: 0 % (ref 0.0–0.2)

## 2022-08-28 LAB — BASIC METABOLIC PANEL
Anion gap: 9 (ref 5–15)
BUN: 12 mg/dL (ref 6–20)
CO2: 26 mmol/L (ref 22–32)
Calcium: 7.9 mg/dL — ABNORMAL LOW (ref 8.9–10.3)
Chloride: 103 mmol/L (ref 98–111)
Creatinine, Ser: <0.3 mg/dL — ABNORMAL LOW (ref 0.44–1.00)
Glucose, Bld: 156 mg/dL — ABNORMAL HIGH (ref 70–99)
Potassium: 3.2 mmol/L — ABNORMAL LOW (ref 3.5–5.1)
Sodium: 138 mmol/L (ref 135–145)

## 2022-08-28 MED ORDER — FLUOXETINE HCL 20 MG PO CAPS
20.0000 mg | ORAL_CAPSULE | Freq: Every day | ORAL | Status: DC
  Administered 2022-08-28 – 2022-08-29 (×2): 20 mg via ORAL
  Filled 2022-08-28 (×2): qty 1

## 2022-08-28 MED ORDER — POTASSIUM CHLORIDE 10 MEQ/100ML IV SOLN
10.0000 meq | INTRAVENOUS | Status: AC
  Administered 2022-08-28 (×2): 10 meq via INTRAVENOUS
  Filled 2022-08-28: qty 100

## 2022-08-28 MED ORDER — MIRTAZAPINE 15 MG PO TABS
7.5000 mg | ORAL_TABLET | Freq: Every day | ORAL | Status: DC
  Administered 2022-08-28: 7.5 mg via ORAL
  Filled 2022-08-28: qty 1

## 2022-08-28 MED ORDER — CLONAZEPAM 0.5 MG PO TABS
0.5000 mg | ORAL_TABLET | Freq: Two times a day (BID) | ORAL | Status: DC
  Administered 2022-08-28 – 2022-08-29 (×3): 0.5 mg via ORAL
  Filled 2022-08-28 (×3): qty 1

## 2022-08-28 MED ORDER — SODIUM CHLORIDE 0.9 % IV BOLUS
500.0000 mL | Freq: Once | INTRAVENOUS | Status: AC
  Administered 2022-08-28: 500 mL via INTRAVENOUS

## 2022-08-28 MED ORDER — RIVAROXABAN 20 MG PO TABS
20.0000 mg | ORAL_TABLET | Freq: Every day | ORAL | Status: DC
  Filled 2022-08-28: qty 1

## 2022-08-28 MED ORDER — CHLORHEXIDINE GLUCONATE CLOTH 2 % EX PADS
6.0000 | MEDICATED_PAD | Freq: Every day | CUTANEOUS | Status: DC
  Administered 2022-08-28 – 2022-08-29 (×2): 6 via TOPICAL

## 2022-08-28 MED ORDER — POLYETHYLENE GLYCOL 3350 17 G PO PACK
17.0000 g | PACK | Freq: Two times a day (BID) | ORAL | Status: DC
  Administered 2022-08-28 – 2022-08-29 (×2): 17 g via ORAL
  Filled 2022-08-28 (×3): qty 1

## 2022-08-28 NOTE — Assessment & Plan Note (Addendum)
Patient had a brief unresponsive episode requiring a sternal rub.  Not sure the etiology of this but patient back to baseline mental status.

## 2022-08-28 NOTE — Consult Note (Signed)
WOC Nurse wound follow up Refer to previous WOC progress notes on 7/17.  Assessed wound appearance; Pt is frequently incontinent of urine and loose stools and it is difficult to keep her wound from becoming soiled.  Pt cleaned and bed changed Sacrum with chronic Stage 4 pressure injury; bone is palpable with a swab, 7X5X.8cm with 5 cm undermining to wound edges, 90% red, 10% yellow.  There is a dry brown scab to the left upper buttock 1X1cm Dressing procedure/placement/frequency: Topical treatment instructions have already been provided for bedside nurses to perform using Medihoney.  Please re-consult if further assistance is needed.  Thank-you,  Cammie Mcgee MSN, RN, CWOCN, [place], CNS [phone]

## 2022-08-28 NOTE — Assessment & Plan Note (Addendum)
-  MiraLAX as needed 

## 2022-08-28 NOTE — Progress Notes (Signed)
Walked into room, patient was not responding to voice or sternum rub. Mother at bedside was unable to arouse patient. After Calling for staff help patient started to respond but does not remember anything. Stated she feels weird but can not explain. BP 143/85 map 101 HR 88. MD notified.

## 2022-08-28 NOTE — Progress Notes (Signed)
PT Cancellation Note  Patient Details Name: Malashia Kamaka MRN: [id number] DOB: January 06, 1966   Cancelled Treatment:    Reason Eval/Treat Not Completed: PT screened, no needs identified, will sign off (Consult received and chart reviewed.  Husband at bedside and assists with history, information-gathering as needed.  At baseline, patient is total care for ADLs and transfers, uses sit/stand lift for transfers between seating surfaces (or family lifts/moves her dependently).  Requires assist to maintain sitting balance and reposition in all sitting activities.  Has participated with therapy previous, but "didn't really help much".  Endorses having a standard bed (with hospital bed features), a sit/stand lift, BSC, shower seat (built in), transport chair, manual WC and pressure-relieving cushion in the home already.  Indep verbalizes understanding of turning schedule and describes schedule/daily flow to therapist. May benefit from use of hoyer lift in the home for additional transfer support; information relayed to [facility].    Husband endorses no acute change in functional status or ability and feels they have adequate support (and equipment) in the home otherwise.  No acute PT needs identified at this time; patient/husband in agreement. Will complete initial order; please re-consult should needs change.)  Fatima Fedie H. Manson Passey, PT, DPT, NCS 08/28/22, 1:49 PM [phone]

## 2022-08-28 NOTE — Plan of Care (Signed)

## 2022-08-28 NOTE — Evaluation (Signed)
Clinical/Bedside Swallow Evaluation Patient Details  Name: Rachel Hall MRN: [id number] Date of Birth: 1965-07-26  Today's Date: 08/28/2022 Time: SLP Start Time (ACUTE ONLY): 0915 SLP Stop Time (ACUTE ONLY): 0935 SLP Time Calculation (min) (ACUTE ONLY): 20 min  Past Medical History:  Past Medical History:  Diagnosis Date   Breast cancer (HCC) 2015   left breast cancer   Hypertension    MS (multiple sclerosis) (HCC)    unable to stand on her own   Personal history of chemotherapy    Personal history of radiation therapy    Post-menopausal    Presence of implanted infusion pump    right abdomen   Status post chemotherapy    left breast cancer   Status post radiation therapy    breast cancer   Past Surgical History:  Past Surgical History:  Procedure Laterality Date   BREAST BIOPSY Left    positive 2015   BREAST LUMPECTOMY Left 2015   invasive, f/u radiation.    COLONOSCOPY WITH PROPOFOL N/A 03/24/2018   Procedure: COLONOSCOPY WITH PROPOFOL;  Surgeon: Toledo, Boykin Nearing, MD;  Location: [facility] ENDOSCOPY;  Service: Gastroenterology;  Laterality: N/A;   ESOPHAGOGASTRODUODENOSCOPY (EGD) WITH PROPOFOL N/A 03/24/2018   Procedure: ESOPHAGOGASTRODUODENOSCOPY (EGD) WITH PROPOFOL;  Surgeon: Toledo, Boykin Nearing, MD;  Location: [facility] ENDOSCOPY;  Service: Gastroenterology;  Laterality: N/A;   INCISION AND DRAINAGE ABSCESS N/A 08/27/2022   Procedure: INCISION AND DRAINAGE ABSCESS;  Surgeon: Linus Salmons, MD;  Location: [facility] ORS;  Service: ENT;  Laterality: N/A;   HPI:  Per H&P "Rachel Hall is a 57 y.o. female with medical history significant of multiple sclerosis.  Patient at baseline has complete palsy of the left lower extremity, almost complete palsy of the right lower extremity, marked palsy of the left upper extremity, some preservation of antigravity strength in the right upper extremity.  She also has chronic cognitive deficit manifesting as memory loss, neglect of symptoms,  and generally poor appetite.  Patient has been bedbound for approximately 3 months.  Patient also has prior known sacral decubitus ulcer.  That has been managed by nurse who is in the family.  Per family, husband and daughter at the bedside, the wound has actually been getting better over this period of time.     Patient was in her usual state of health till about 4 days ago when patient had even a worse appetite and refusing to eat and drink much.  There is no report of fever or rigors.  Subsequently patient was noted over the last 48 hours to have a swelling developing just above the left angle of the jaw as well as just inferior to the angle of the jaw.  Subsequently patient started reporting pain at the site with attempts to feed her and patient was noted to have trouble opening her mouth.  Patient is brought to the ER.     During this encounter most of the history is from the family and record review and signout.  Patient is not much helpful in providing history." Pt s/p I&D of L neck abscess on 08/27/22. CXR on admit negative.    Assessment / Plan / Recommendation  Clinical Impression  Pt seen for clinical swallowing evaluation. Oral motor examination completed for reduced mandibular ROM, lingual weakness/incoordination bilaterally, and black patches on lingual body which SLP removed with oral care. Pt presents with a moderate oral dysphagia c/b oral holding/delayed oral transit of purees and prolonged/inefficient mastication of solids. Pt's oral swallow function likely  limited by pain (pt commenting it "hurts" to chew) and cognitive status. Pt required verbal cues to swallow. Pharyngeal swallow appeared [facility] per clincial assessment. Pt dependent for feesting.Discussed diet options with pt and family. At this time, pt elects to consume a full liquid diet with plan for clinical re-assessment next date. Recommend a full liquid diet with safe swallowing strategies/aspiration precations as outlined below. SLP to  f/u per POC for diet tolerance and trials of upgraded textures. SLP Visit Diagnosis: Dysphagia, oral phase (R13.11)    Aspiration Risk  Mild aspiration risk    Diet Recommendation Thin liquid (full liquids vs puree; pt elected full liquids)    Medication Administration: Crushed with puree Supervision: Full supervision/cueing for compensatory strategies;Staff to assist with self feeding Compensations: Minimize environmental distractions;Slow rate;Small sips/bites (cues to swallow)    Other  Recommendations Oral Care Recommendations: Oral care QID;Staff/trained caregiver to provide oral care    Recommendations for follow up therapy are one component of a multi-disciplinary discharge planning process, led by the attending physician.  Recommendations may be updated based on patient status, additional functional criteria and insurance authorization.  Follow up Recommendations  (likely no SLP f/u recommended)         Functional Status Assessment Patient has had a recent decline in their functional status and demonstrates the ability to make significant improvements in function in a reasonable and predictable amount of time.  Frequency and Duration min 2x/week  2 weeks       Prognosis Prognosis for improved oropharyngeal function: Fair Barriers to Reach Goals: Cognitive deficits;Severity of deficits      Swallow Study   General Date of Onset: 08/26/22 HPI: Per H&P "Rachel Hall is a 57 y.o. female with medical history significant of multiple sclerosis.  Patient at baseline has complete palsy of the left lower extremity, almost complete palsy of the right lower extremity, marked palsy of the left upper extremity, some preservation of antigravity strength in the right upper extremity.  She also has chronic cognitive deficit manifesting as memory loss, neglect of symptoms, and generally poor appetite.  Patient has been bedbound for approximately 3 months.  Patient also has prior known  sacral decubitus ulcer.  That has been managed by nurse who is in the family.  Per family, husband and daughter at the bedside, the wound has actually been getting better over this period of time.     Patient was in her usual state of health till about 4 days ago when patient had even a worse appetite and refusing to eat and drink much.  There is no report of fever or rigors.  Subsequently patient was noted over the last 48 hours to have a swelling developing just above the left angle of the jaw as well as just inferior to the angle of the jaw.  Subsequently patient started reporting pain at the site with attempts to feed her and patient was noted to have trouble opening her mouth.  Patient is brought to the ER.     During this encounter most of the history is from the family and record review and signout.  Patient is not much helpful in providing history." Pt s/p I&D of L neck abscess on 08/27/22. CXR on admit negative. Type of Study: Bedside Swallow Evaluation Previous Swallow Assessment: none Diet Prior to this Study:  (clear liquids) Respiratory Status: Room air History of Recent Intubation: No Behavior/Cognition: Alert;Cooperative;Pleasant mood Oral Cavity Assessment: Dried secretions (debrided during oral care) Oral Care Completed  by SLP: Yes Oral Cavity - Dentition: Missing dentition Vision: Functional for self-feeding Self-Feeding Abilities: Total assist Patient Positioning: Upright in bed Baseline Vocal Quality: Low vocal intensity Volitional Cough: Strong    Oral/Motor/Sensory Function Overall Oral Motor/Sensory Function: Generalized oral weakness Facial ROM: Within Functional Limits Lingual ROM: Reduced right;Reduced left Lingual Strength: Reduced (bilaterally) Mandible: Impaired (reduced jaw opening)   Ice Chips Ice chips: Not tested   Thin Liquid Thin Liquid: Within functional limits Presentation: Straw    Nectar Thick Nectar Thick Liquid: Not tested   Honey Thick Honey Thick  Liquid: Not tested   Puree Puree: Impaired Oral Phase Impairments: Poor awareness of bolus;Reduced lingual movement/coordination Oral Phase Functional Implications: Prolonged oral transit;Oral holding (cues to swallow) Pharyngeal Phase Impairments:  [facility])   Solid     Solid: Impaired Oral Phase Impairments: Reduced lingual movement/coordination;Poor awareness of bolus;Impaired mastication Oral Phase Functional Implications: Prolonged oral transit;Impaired mastication;Oral residue;Oral holding Pharyngeal Phase Impairments:  (WFL)     Clyde Canterbury, M.S., CCC-SLP Speech-Language Pathologist [facility] [phone] Arnette Felts)  Woodroe Chen 08/28/2022,12:16 PM

## 2022-08-28 NOTE — TOC Initial Note (Signed)
Transition of Care [facility]) - Initial/Assessment Note    Patient Details  Name: Rachel Hall MRN: [id number] Date of Birth: 12-Apr-1965  [facility]) CM/SW Contact:    Allena Katz, LCSW Phone Number: 08/28/2022, 10:36 AM  Clinical Narrative:   Pt admitted with neck abscess. Pt also has a stage 3 PI on her sacrum which patient reports a nurse in her family has helped manage.  PT ordered for this patient. TOC following to see if patient has post discharge needs.                    Patient Goals and CMS Choice            Expected Discharge Plan and Services                                              Prior Living Arrangements/Services                       Activities of Daily Living Home Assistive Devices/Equipment: None ADL Screening (condition at time of admission) Patient's cognitive ability adequate to safely complete daily activities?: No Is the patient deaf or have difficulty hearing?: No Does the patient have difficulty seeing, even when wearing glasses/contacts?: No Does the patient have difficulty concentrating, remembering, or making decisions?: Yes Patient able to express need for assistance with ADLs?: Yes Does the patient have difficulty dressing or bathing?: Yes Independently performs ADLs?: No Communication: Independent Dressing (OT): Needs assistance Is this a change from baseline?: Pre-admission baseline Grooming: Needs assistance Is this a change from baseline?: Pre-admission baseline Feeding: Needs assistance Is this a change from baseline?: Pre-admission baseline Bathing: Needs assistance Is this a change from baseline?: Pre-admission baseline Toileting: Needs assistance Is this a change from baseline?: Pre-admission baseline In/Out Bed: Needs assistance Is this a change from baseline?: Pre-admission baseline Walks in Home: Needs assistance Is this a change from baseline?: Pre-admission baseline Does the  patient have difficulty walking or climbing stairs?: Yes Weakness of Legs: Both Weakness of Arms/Hands: Left  Permission Sought/Granted                  Emotional Assessment              Admission diagnosis:  Dental abscess [K04.7] Abscess [L02.91] Facial swelling [R22.0] Severe sepsis (HCC) [A41.9, R65.20] Pressure injury of skin of sacral region, unspecified injury stage [L89.159] Patient Active Problem List   Diagnosis Date Noted   Elevated liver function tests 08/27/2022   Hypokalemia 08/27/2022   Hyponatremia 08/27/2022   Lactic acidosis 08/27/2022   Abscess 08/26/2022   Pressure ulcer 08/26/2022   Anemia, iron deficiency 02/15/2018   Osteoporosis 07/09/2015   Presence of intrathecal baclofen pump 01/11/2015   Acute embolism and thrombosis of deep vein of left proximal lower extremity (HCC) 09/11/2014   History of DVT (deep vein thrombosis) 09/11/2014   Depression, major, in remission (HCC) 09/07/2014   Pure hypercholesterolemia 09/07/2014   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 [address] Phone: [phone] Fax: [phone]  [facility] - [place], [place] - [address] [place] [address] Phone: [phone] Fax: [phone]  [id number] [place], [place] - [address] AT [facility] [address] [place] [address] Phone: [phone] Fax: [phone]     Social Determinants of Health (SDOH) Social History: SDOH Screenings   Food Insecurity: No Food  Insecurity (08/27/2022)  Housing: Low Risk  (08/27/2022)  Transportation Needs: No Transportation Needs (08/27/2022)  Utilities: Not At Risk (08/27/2022)  Tobacco Use: Low Risk  (08/27/2022)   SDOH Interventions:     Readmission Risk Interventions     No data to display

## 2022-08-28 NOTE — Progress Notes (Signed)
08/28/2022 1:24 PM  Rachel Hall [id number]  Post-Op Day 1    Temp:  [96.8 F (36 C)-97.6 F (36.4 C)] 97.6 F (36.4 C) (07/18 0840) Pulse Rate:  [84-96] 84 (07/18 0840) Resp:  [13-26] 18 (07/18 0813) BP: (115-143)/(73-86) 143/85 (07/18 0813) SpO2:  [96 %-99 %] 99 % (07/18 0840),    No intake or output data in the 24 hours ending 08/28/22 1324  Results for orders placed or performed during the hospital encounter of 08/26/22 (from the past 24 hour(s))  CBG monitoring, ED     Status: Abnormal   Collection Time: 08/27/22  1:48 PM  Result Value Ref Range   Glucose-Capillary 158 (H) 70 - 99 mg/dL  APTT     Status: None   Collection Time: 08/27/22  4:00 PM  Result Value Ref Range   aPTT 32 24 - 36 seconds  Protime-INR     Status: Abnormal   Collection Time: 08/27/22  4:00 PM  Result Value Ref Range   Prothrombin Time 16.4 (H) 11.4 - 15.2 seconds   INR 1.3 (H) 0.8 - 1.2  Hepatitis panel, acute     Status: None   Collection Time: 08/27/22  4:00 PM  Result Value Ref Range   Hepatitis B Surface Ag NON REACTIVE NON REACTIVE   HCV Ab NON REACTIVE NON REACTIVE   Hep A IgM NON REACTIVE NON REACTIVE   Hep B C IgM NON REACTIVE NON REACTIVE  CBC     Status: Abnormal   Collection Time: 08/28/22  4:36 AM  Result Value Ref Range   WBC 12.8 (H) 4.0 - 10.5 K/uL   RBC 3.93 3.87 - 5.11 MIL/uL   Hemoglobin 10.6 (L) 12.0 - 15.0 g/dL   HCT 40.1 (L) 02.7 - 25.3 %   MCV 81.4 80.0 - 100.0 fL   MCH 27.0 26.0 - 34.0 pg   MCHC 33.1 30.0 - 36.0 g/dL   RDW 66.4 (H) 40.3 - 47.4 %   Platelets 417 (H) 150 - 400 K/uL   nRBC 0.0 0.0 - 0.2 %  Basic metabolic panel     Status: Abnormal   Collection Time: 08/28/22  4:36 AM  Result Value Ref Range   Sodium 138 135 - 145 mmol/L   Potassium 3.2 (L) 3.5 - 5.1 mmol/L   Chloride 103 98 - 111 mmol/L   CO2 26 22 - 32 mmol/L   Glucose, Bld 156 (H) 70 - 99 mg/dL   BUN 12 6 - 20 mg/dL   Creatinine, Ser <2.59 (L) 0.44 - 1.00 mg/dL   Calcium 7.9 (L) 8.9 -  10.3 mg/dL   GFR, Estimated NOT CALCULATED >60 mL/min   Anion gap 9 5 - 15    SUBJECTIVE:  Had syncopal episode this am, hospitalist investigating cause.  She says her neck feels much better.  OBJECTIVE:  Dressing removed, iodoform gauze packing pulled out 3-4cm and clipped.  Neck and wound much improved.  IMPRESSION:  S/P I & D neck abscess doing much better  PLAN:  Her mom as primary care giver was in the room when I changed the dressing and advanced out the packing.  This packing will be advanced out slowly 3-4 inches every day until it is all out.  Mom says she can easily do this at home-she must be careful to leave a tag out so the packing does not disappear into the abscess pocket.  She understands.  I will stop by tomorrow mid day after surgery in [place]  to advance the packing some more.  She could go home anytime after that, would advise sending home on Augmentin 875 bid for 2 weeks.  Mom is organizing appointment with her dentist to have her teeth addressed.    Rachel Hall 08/28/2022, 1:24 PM

## 2022-08-28 NOTE — Progress Notes (Signed)
Progress Note   Patient: Rachel Hall PPI:[id number] DOB: [birth year] DOA: 08/26/2022     2 DOS: the patient was seen and examined on 08/28/2022   Brief hospital course: 57 y.o. female with medical history significant of multiple sclerosis.  Patient at baseline has complete palsy of the left lower extremity, almost complete palsy of the right lower extremity, marked palsy of the left upper extremity, some preservation of antigravity strength in the right upper extremity.  She also has chronic cognitive deficit manifesting as memory loss, neglect of symptoms, and generally poor appetite.  Patient has been bedbound for approximately 3 months.  Patient also has prior known sacral decubitus ulcer.  That has been managed by nurse who is in the family.  Per family, husband and daughter at the bedside, the wound has actually been getting better over this period of time.   Patient was in her usual state of health till about 4 days ago when patient had even a worse appetite and refusing to eat and drink much.  There is no report of fever or rigors.  Subsequently patient was noted over the last 48 hours to have a swelling developing just above the left angle of the jaw as well as just inferior to the angle of the jaw.  Subsequently patient started reporting pain at the site with attempts to feed her and patient was noted to have trouble opening her mouth.  Patient is brought to the ER.  7/17.  Patient brought to the operating room by Dr. Jenne Campus for drainage of abscess.  I added vancomycin to the Unasyn until cultures are back. 7/18.  Patient was less responsive and needed to sternal rub to wake up.  When I came into the room she was answering questions appropriately.  She has no recollection of being in the hospital.  Patient has baseline left-sided weakness.  Assessment and Plan: * Abscess With leukocytosis.  Patient has abscess of the around the left mandibular body.  Patient brought to the operating  room by Dr. Jenne Campus for drainage of abscess 7/17.  Continue Unasyn and vancomycin until cultures are back.  Moderate gram-positive cocci in chains and pairs. Sepsis ruled out.  Episode of unresponsiveness Patient had a brief unresponsive episode requiring a sternal rub.  When I arrived in the room she was answering questions appropriately but did not recall that she was in the hospital.  Lactic acidosis Improved with fluids  Muscle spasticity With history of MS chronic left-sided hemiparesis.  Patient has a baclofen pump in situ, this is apparently still being used.  Monitor clinically.  Pressure ulcer Chronic stage IV as per wound care nurse.  Present on admission.  Elevated liver function tests Could be secondary to infection.  Continue to monitor.  Hepatitis profile was ordered by admitting physician.  Hypokalemia Replace potassium IV  Hyponatremia Sodium a few points less than the normal range.  History of DVT (deep vein thrombosis) Held Xarelto for operating room.  Primary cancer of upper outer quadrant of left female breast [facility]) This is a remote diagnosis.  Currently felt to be in remission.  Patient has a port  Constipation Increase MiraLAX to twice daily        Subjective: Patient this morning had an episode of confusion and needed to sternal rub to wake up.  She has no memory of the hospitalization so far.  Able to answer questions appropriately.  Baseline weakness on the left side secondary to MS.  Physical Exam: Vitals:  08/27/22 2036 08/28/22 0451 08/28/22 0813 08/28/22 0840  BP: 115/73 134/83 (!) 143/85   Pulse: 88 84 88 84  Resp: 18 18 18    Temp: (!) 97.5 F (36.4 C) (!) 97.5 F (36.4 C)  97.6 F (36.4 C)  TempSrc: Oral Oral    SpO2: 99% 99%  99%  Weight:      Height:       Physical Exam HENT:     Head: Normocephalic.     Mouth/Throat:     Pharynx: No oropharyngeal exudate.  Eyes:     General: Lids are normal.     Conjunctiva/sclera:  Conjunctivae normal.  Cardiovascular:     Rate and Rhythm: Normal rate and regular rhythm.     Heart sounds: Normal heart sounds, S1 normal and S2 normal.  Pulmonary:     Breath sounds: No decreased breath sounds, wheezing, rhonchi or rales.  Abdominal:     Palpations: Abdomen is soft.     Tenderness: There is no abdominal tenderness.  Musculoskeletal:     Right lower leg: No swelling.     Left lower leg: No swelling.  Skin:    General: Skin is warm.     Findings: No rash.     Comments: Bandage over left neck.  Neurological:     Mental Status: She is alert.     Comments: Patient unable to move left leg.  Weakness left arm.     Data Reviewed: Potassium 3.2, creatinine 0.3, white blood cell count 12.8, hemoglobin 10.6, platelet count 417  Family Communication: Spoke with family at the bedside  Disposition: Status is: Inpatient Remains inpatient appropriate because: Patient had an unresponsive episode this morning.  Continue to monitor.   Planned Discharge Destination: Home with Home Health    Time spent: 28 minutes  Author: Alford Highland, MD 08/28/2022 12:19 PM  For on call review [email or web address].

## 2022-08-29 DIAGNOSIS — R404 Transient alteration of awareness: Secondary | ICD-10-CM | POA: Diagnosis not present

## 2022-08-29 DIAGNOSIS — M62838 Other muscle spasm: Secondary | ICD-10-CM | POA: Diagnosis not present

## 2022-08-29 DIAGNOSIS — E872 Acidosis, unspecified: Secondary | ICD-10-CM | POA: Diagnosis not present

## 2022-08-29 DIAGNOSIS — B370 Candidal stomatitis: Secondary | ICD-10-CM

## 2022-08-29 DIAGNOSIS — K5909 Other constipation: Secondary | ICD-10-CM

## 2022-08-29 DIAGNOSIS — L0291 Cutaneous abscess, unspecified: Secondary | ICD-10-CM | POA: Diagnosis not present

## 2022-08-29 LAB — CULTURE, BLOOD (ROUTINE X 2)

## 2022-08-29 MED ORDER — FLUCONAZOLE 100 MG PO TABS: 100.0000 mg | ORAL_TABLET | Freq: Every day | ORAL | 0 refills | Status: DC

## 2022-08-29 MED ORDER — MEDIHONEY WOUND/BURN DRESSING EX PSTE: 1.0000 | PASTE | Freq: Every day | CUTANEOUS | 0 refills | Status: DC

## 2022-08-29 MED ORDER — AMOXICILLIN-POT CLAVULANATE 400-57 MG/5ML PO SUSR: 10.0000 mL | Freq: Two times a day (BID) | ORAL | 0 refills | Status: AC

## 2022-08-29 MED ORDER — AMOXICILLIN-POT CLAVULANATE 400-57 MG/5ML PO SUSR: 10.0000 mL | Freq: Two times a day (BID) | ORAL | 0 refills | Status: DC

## 2022-08-29 MED ORDER — POLYETHYLENE GLYCOL 3350 17 G PO PACK: 17.0000 g | PACK | Freq: Every day | ORAL | 0 refills | Status: DC

## 2022-08-29 MED ORDER — HEPARIN SOD (PORK) LOCK FLUSH 100 UNIT/ML IV SOLN
500.0000 [IU] | Freq: Once | INTRAVENOUS | Status: AC
  Administered 2022-08-29: 500 [IU] via INTRAVENOUS

## 2022-08-29 MED ORDER — FLUCONAZOLE 100 MG PO TABS
200.0000 mg | ORAL_TABLET | Freq: Once | ORAL | Status: AC
  Administered 2022-08-29: 200 mg via ORAL
  Filled 2022-08-29: qty 2

## 2022-08-29 MED ORDER — FLUCONAZOLE 100 MG PO TABS: 100.0000 mg | ORAL_TABLET | Freq: Every day | ORAL | Status: DC

## 2022-08-29 MED ORDER — AMOXICILLIN-POT CLAVULANATE 400-57 MG/5ML PO SUSR
10.0000 mL | Freq: Two times a day (BID) | ORAL | Status: DC
  Administered 2022-08-29: 10 mL via ORAL
  Filled 2022-08-29: qty 10

## 2022-08-29 NOTE — TOC Progression Note (Addendum)
Transition of Care [facility]) - Progression Note    Patient Details  Name: Rachel Hall MRN: [id number] Date of Birth: 08-Sep-1965  Transition of Care [facility]) CM/SW Contact  Liliana Cline, LCSW Phone Number: 08/29/2022, 9:31 AM  Clinical Narrative:    Per PT and TOC handoff, patient would benefit from a hoyer lift. Orders also in for [facility]. Left VM for spouse requesting return call to determine if they would like a hoyer lift ordered for the home and if they want HH.        Expected Discharge Plan and Services         Expected Discharge Date: 08/29/22                                     Social Determinants of Health (SDOH) Interventions SDOH Screenings   Food Insecurity: No Food Insecurity (08/27/2022)  Housing: Low Risk  (08/27/2022)  Transportation Needs: No Transportation Needs (08/27/2022)  Utilities: Not At Risk (08/27/2022)  Tobacco Use: Low Risk  (08/27/2022)    Readmission Risk Interventions     No data to display

## 2022-08-29 NOTE — Plan of Care (Addendum)
Patient is alert and oriented X4. Discharge teaching given. Problem: Education: Goal: Knowledge of General Education information will improve Description: Including pain rating scale, medication(s)/side effects and non-pharmacologic comfort measures Outcome: Completed/Met   Problem: Health Behavior/Discharge Planning: Goal: Ability to manage health-related needs will improve Outcome: Completed/Met   Problem: Clinical Measurements: Goal: Ability to maintain clinical measurements within normal limits will improve Outcome: Completed/Met Goal: Will remain free from infection Outcome: Completed/Met Goal: Diagnostic test results will improve Outcome: Completed/Met Goal: Respiratory complications will improve Outcome: Completed/Met Goal: Cardiovascular complication will be avoided Outcome: Completed/Met   Problem: Activity: Goal: Risk for activity intolerance will decrease Outcome: Completed/Met   Problem: Nutrition: Goal: Adequate nutrition will be maintained Outcome: Completed/Met   Problem: Coping: Goal: Level of anxiety will decrease Outcome: Completed/Met   Problem: Elimination: Goal: Will not experience complications related to bowel motility Outcome: Completed/Met Goal: Will not experience complications related to urinary retention Outcome: Completed/Met   Problem: Pain Managment: Goal: General experience of comfort will improve Outcome: Completed/Met   Problem: Safety: Goal: Ability to remain free from injury will improve Outcome: Completed/Met   Problem: Skin Integrity: Goal: Risk for impaired skin integrity will decrease Outcome: Completed/Met   

## 2022-08-29 NOTE — Assessment & Plan Note (Signed)
Prescribed Diflucan.

## 2022-08-29 NOTE — Progress Notes (Signed)
08/29/2022 11:09 AM  Rachel Hall [id number]  Post-Op Day 2    Temp:  [97.6 F (36.4 C)-97.9 F (36.6 C)] 97.9 F (36.6 C) (07/19 0811) Pulse Rate:  [80-88] 82 (07/19 0811) Resp:  [17-18] 18 (07/19 0811) BP: (108-138)/(65-84) 138/84 (07/19 0811) SpO2:  [98 %-100 %] 99 % (07/19 0811),    No intake or output data in the 24 hours ending 08/29/22 1109  No results found for this or any previous visit (from the past 24 hour(s)).  SUBJECTIVE:  Feeling better, neck pain much improved  OBJECTIVE:  wound healing, iodoform gauze advanced outward another 4-5 inches  IMPRESSION:  s/p I & D neck abscess  PLAN:  from ENT standpoint, ok to dc to home.  Mom and husband have been instructed on wound care and slow removal of the iodoform gauze packing.  I will follow up with her in a week.  Would DC on Augmentin 875 bid for 2 weeks.    Davina Poke 08/29/2022, 11:09 AM

## 2022-08-29 NOTE — Progress Notes (Signed)
Speech Language Pathology Treatment: Dysphagia  Patient Details Name: Rachel Hall MRN: [id number] DOB: [birth year] Today's Date: 08/29/2022 Time: 4098-1191 SLP Time Calculation (min) (ACUTE ONLY): 15 min  Assessment / Plan / Recommendation Clinical Impression  Pt seen for diet tolerance and trials of upgraded textures. Pt being fed breakfast by mother. Pt eager to upgrade solids and reports reduced pain in L jaw. Pt observed with items from full liquid meal tray as well as solid trials. Pt with prolonged mastication of solids with tendency to chew on R side and mild oral stasis with purees and solids which cleared with liquid wash. Pharyngeal swallow appeared [facility]. Pt is at mildly increased risk for aspiration PNA given comorbidities, dental status, and dependence for feeding. Pt and mother educated re: POC, diet recommendations, and safe swallowing strategies. Recommend diet upgrade to mech soft and thin liquids to allow pt to select softer solids to consume. Safe swallowing strategies/aspiration precautions as outlined below. SLP to sign off as pt has no acute SLP needs at this time.   HPI HPI: Per H&P "Rachel Hall is a 57 y.o. female with medical history significant of multiple sclerosis.  Patient at baseline has complete palsy of the left lower extremity, almost complete palsy of the right lower extremity, marked palsy of the left upper extremity, some preservation of antigravity strength in the right upper extremity.  She also has chronic cognitive deficit manifesting as memory loss, neglect of symptoms, and generally poor appetite.  Patient has been bedbound for approximately 3 months.  Patient also has prior known sacral decubitus ulcer.  That has been managed by nurse who is in the family.  Per family, husband and daughter at the bedside, the wound has actually been getting better over this period of time.     Patient was in her usual state of health till about 4 days ago when patient  had even a worse appetite and refusing to eat and drink much.  There is no report of fever or rigors.  Subsequently patient was noted over the last 48 hours to have a swelling developing just above the left angle of the jaw as well as just inferior to the angle of the jaw.  Subsequently patient started reporting pain at the site with attempts to feed her and patient was noted to have trouble opening her mouth.  Patient is brought to the ER.     During this encounter most of the history is from the family and record review and signout.  Patient is not much helpful in providing history." Pt s/p I&D of L neck abscess on 08/27/22. CXR on admit negative.      SLP Plan  All goals met      Recommendations for follow up therapy are one component of a multi-disciplinary discharge planning process, led by the attending physician.  Recommendations may be updated based on patient status, additional functional criteria and insurance authorization.    Recommendations  Diet recommendations: Dysphagia 3 (mechanical soft);Thin liquid Liquids provided via: Teaspoon;Cup;Straw Medication Administration: Crushed with puree Supervision: Trained caregiver to feed patient Compensations: Minimize environmental distractions;Slow rate;Small sips/bites (cues to swallow PRN) Postural Changes and/or Swallow Maneuvers: Seated upright 90 degrees;Upright 30-60 min after meal                  Oral care QID;Staff/trained caregiver to provide oral care   Frequent or constant Supervision/Assistance Dysphagia, oral phase (R13.11)     All goals met   Clyde Canterbury, M.S.,  CCC-SLP Speech-Language Pathologist [facility] [phone] (ASCOM)   Woodroe Chen  08/29/2022, 9:46 AM

## 2022-08-29 NOTE — Progress Notes (Signed)
Brief Nutrition Note  Pt with discharge orders in and plan to discharge home with home health today.   If further nutrition needs arise, please re-consult RD.   Levada Schilling, RD, LDN, CDCES Registered Dietitian II Certified Diabetes Care and Education Specialist Please refer to [facility] for RD and/or RD on-call/weekend/after hours pager

## 2022-08-29 NOTE — TOC Transition Note (Signed)
Transition of Care [facility]) - CM/SW Discharge Note   Patient Details  Name: Rachel Hall MRN: [id number] Date of Birth: [birth year]  Transition of Care [facility]) CM/SW Contact:  Liliana Cline, LCSW Phone Number: 08/29/2022, 11:25 AM   Clinical Narrative:    Spoke to patient's Mother Vernona Rieger).  She is agreeable to a hoyer lift as recommended by PT. Referral made to Jon with [employer] and informed him to call Vernona Rieger for scheduling of delivery per her request. She lives next door to patient and spouse. Confirmed home address in chart is correct. She is also agreeable to [facility] - had it in the past and cannot recall agency. No agency preference. Per [employer] portal, patient had [facility] in the past. Referral accepted by [facility] with [facility]urbish who is aware of DC home today.  Vernona Rieger states patient does not need EMS, family to transport home.    Final next level of care: Home w Home Health Services Barriers to Discharge: Barriers Resolved   Patient Goals and CMS Choice CMS Medicare.gov Compare Post Acute Care list provided to:: Patient Represenative (must comment) Choice offered to / list presented to : Parent  Discharge Placement                  Patient to be transferred to facility by: family to transport per Vernona Rieger Name of family member notified: Vernona Rieger Patient and family notified of of transfer: 08/29/22  Discharge Plan and Services Additional resources added to the After Visit Summary for                  DME Arranged: Other see comment (hoyer lift) DME Agency: [employer] Date DME Agency Contacted: 08/29/22   Representative spoke with at DME Agency: [facility] [place] HH Arranged: PT, RN, Nurse's Aide HH Agency: [facility] Date [facility] Agency Contacted: 08/29/22   Representative spoke with at [facility] Agency: Kandee Keen  Social Determinants of Health (SDOH) Interventions SDOH Screenings   Food Insecurity: No Food Insecurity (08/27/2022)  Housing: Low Risk  (08/27/2022)  Transportation Needs: No  Transportation Needs (08/27/2022)  Utilities: Not At Risk (08/27/2022)  Tobacco Use: Low Risk  (08/27/2022)     Readmission Risk Interventions     No data to display

## 2022-08-29 NOTE — Care Management Important Message (Signed)
Important Message  Patient Details  Name: Rachel Hall MRN: [id number] Date of Birth: Aug 02, 1965   Medicare Important Message Given:  N/A - LOS <3 / Initial given by admissions     Olegario Messier A Kania Regnier 08/29/2022, 9:16 AM

## 2022-08-29 NOTE — Discharge Instructions (Signed)
Sacral wound: 1 application of medihoney (thin layer) daily to wound and fluffed 4X4 sterile gauze daily.  Cover with foam dressing every three days or change for soiling

## 2022-08-29 NOTE — Plan of Care (Signed)
°  Problem: Education: °Goal: Knowledge of General Education information will improve °Description: Including pain rating scale, medication(s)/side effects and non-pharmacologic comfort measures °Outcome: Progressing °  °Problem: Health Behavior/Discharge Planning: °Goal: Ability to manage health-related needs will improve °Outcome: Progressing °  °Problem: Clinical Measurements: °Goal: Respiratory complications will improve °Outcome: Progressing °Goal: Cardiovascular complication will be avoided °Outcome: Progressing °  °Problem: Nutrition: °Goal: Adequate nutrition will be maintained °Outcome: Progressing °  °

## 2022-08-29 NOTE — Discharge Summary (Signed)
Physician Discharge Summary   Patient: Rachel Hall MRN: [id number] DOB: September 02, 1965  Admit date:     08/26/2022  Discharge date: 08/29/22  Discharge Physician: Alford Highland   PCP: Lauro Regulus, MD   Recommendations at discharge:   Follow-up PCP 5 days Follow-up ENT 2 weeks Keep appointment with dentist Consider referral to plastic surgery for decubitus ulcer.  Discharge Diagnoses: Principal Problem:   Abscess Active Problems:   Episode of unresponsiveness   Lactic acidosis   Muscle spasticity   Pressure ulcer   Elevated liver function tests   Hypokalemia   Hyponatremia   History of DVT (deep vein thrombosis)   Primary cancer of upper outer quadrant of left female breast [facility])   Constipation   [facility] Course: 57 y.o. female with medical history significant of multiple sclerosis.  Patient at baseline has complete palsy of the left lower extremity, almost complete palsy of the right lower extremity, marked palsy of the left upper extremity, some preservation of antigravity strength in the right upper extremity.  She also has chronic cognitive deficit manifesting as memory loss, neglect of symptoms, and generally poor appetite.  Patient has been bedbound for approximately 3 months.  Patient also has prior known sacral decubitus ulcer.  That has been managed by nurse who is in the family.  Per family, husband and daughter at the bedside, the wound has actually been getting better over this period of time.   Patient was in her usual state of health till about 4 days ago when patient had even a worse appetite and refusing to eat and drink much.  There is no report of fever or rigors.  Subsequently patient was noted over the last 48 hours to have a swelling developing just above the left angle of the jaw as well as just inferior to the angle of the jaw.  Subsequently patient started reporting pain at the site with attempts to feed her and patient was noted to  have trouble opening her mouth.  Patient is brought to the ER.  7/17.  Patient brought to the operating room by Dr. Jenne Campus for drainage of abscess.  I added vancomycin to the Unasyn until cultures are back. 7/18.  Patient was less responsive and needed to sternal rub to wake up.  When I came into the room she was answering questions appropriately.  She has no recollection of being in the hospital.  Patient has baseline left-sided weakness. 7/19.  Streptococcus intermedius growing out of culture sensitive to penicillin.  Dr. Jenne Campus recommended Augmentin for total of 2 weeks.  Assessment and Plan: * Abscess With leukocytosis.  Patient has abscess of the around the left mandibular body.  Patient brought to the operating room by Dr. Jenne Campus for drainage of abscess 7/17.  Patient was on Unasyn and vancomycin.  Vancomycin discontinued with Streptococcus intermedius growing out of culture.  Switch over to Augmentin for total of 2 weeks upon going home.  Patient has an appointment with a dentist to remove the tooth.  Episode of unresponsiveness Patient had a brief unresponsive episode requiring a sternal rub.  Not sure the etiology of this but patient back to baseline mental status.  Lactic acidosis Improved with fluids  Muscle spasticity With history of MS chronic left-sided hemiparesis.  Patient has a baclofen pump in situ, this is apparently still being used.  Monitor clinically.  Pressure ulcer Chronic stage IV as per wound care nurse.  Present on admission.  Continue local  wound care with [facility].  Consider outpatient referral to plastic surgery for repair.  Elevated liver function tests Could be secondary to infection.  Continue to monitor.  Hepatitis profile was ordered by admitting physician.  Hypokalemia Replaced  Hyponatremia Last sodium normal range at 138  History of DVT (deep vein thrombosis) Patient back on Xarelto  Primary cancer of upper outer quadrant of left female  breast [facility]) This is a remote diagnosis.  Currently felt to be in remission.  Patient has a port  Thrush Prescribed Diflucan.  Constipation MiraLAX as needed.         Consultants: ENT Procedures performed: ENT drainage of abscess Disposition: Home health Diet recommendation:  Patient eats mostly full liquid diet but can have pure. DISCHARGE MEDICATION: Allergies as of 08/29/2022   No Known Allergies      Medication List     STOP taking these medications    alendronate 70 MG tablet Commonly known as: FOSAMAX   amLODipine 5 MG tablet Commonly known as: NORVASC       TAKE these medications    acetaminophen 325 MG tablet Commonly known as: TYLENOL Take 650 mg by mouth every 4 (four) hours as needed.   amantadine 100 MG capsule Commonly known as: SYMMETREL Take 100 mg by mouth 2 (two) times daily.   amoxicillin-clavulanate 400-57 MG/5ML suspension Commonly known as: AUGMENTIN Take 10 mLs by mouth every 12 (twelve) hours for 12 days.   Calcium Carbonate-Vitamin D 600-400 MG-UNIT tablet Take 1 tablet by mouth 2 (two) times daily.   clonazePAM 0.5 MG tablet Commonly known as: KLONOPIN Take 0.5 mg by mouth 2 (two) times daily.   ferrous sulfate 325 (65 FE) MG EC tablet Take 325 mg by mouth daily with breakfast.   fluconazole 100 MG tablet Commonly known as: DIFLUCAN Take 1 tablet (100 mg total) by mouth daily. Start taking on: August 30, 2022   FLUoxetine 20 MG capsule Commonly known as: PROZAC Take 20 mg by mouth daily.   leptospermum manuka honey Pste paste Apply 1 Application topically daily.   mirtazapine 7.5 MG tablet Commonly known as: REMERON Take 7.5 mg by mouth at bedtime.   oxybutynin 5 MG tablet Commonly known as: DITROPAN Take 5 mg by mouth 2 (two) times daily.   pantoprazole 40 MG tablet Commonly known as: PROTONIX Take 40 mg by mouth daily.   polyethylene glycol 17 g packet Commonly known as: MIRALAX / GLYCOLAX Take 17 g by  mouth daily.   rivaroxaban 20 MG Tabs tablet Commonly known as: XARELTO Take 20 mg by mouth daily with supper.               Durable Medical Equipment  (From admission, onward)           Start     Ordered   08/29/22 1219  For home use only DME Other see comment  Once       Comments: Michiel Sites lift  Question:  Length of Need  Answer:  Lifetime   08/29/22 1218            Follow-up Information     Lauro Regulus, MD Follow up in 5 day(s).   Specialty: Internal Medicine Contact information: [address] [facility] [place] [address] [phone]         Linus Salmons, MD Follow up in 2 week(s).   Specialty: Otolaryngology Contact information: [address] [place] [address] [place] [address] [phone]  Discharge Exam: Filed Weights   08/26/22 1419 08/27/22 0658  Weight: 57.6 kg 57.6 kg   Physical Exam HENT:     Head: Normocephalic.     Comments: Thrush on the back of the mouth and tongue.    Mouth/Throat:     Pharynx: No oropharyngeal exudate.  Eyes:     General: Lids are normal.     Conjunctiva/sclera: Conjunctivae normal.  Cardiovascular:     Rate and Rhythm: Normal rate and regular rhythm.     Heart sounds: Normal heart sounds, S1 normal and S2 normal.  Pulmonary:     Breath sounds: No decreased breath sounds, wheezing, rhonchi or rales.  Abdominal:     Palpations: Abdomen is soft.     Tenderness: There is no abdominal tenderness.  Musculoskeletal:     Right lower leg: No swelling.     Left lower leg: No swelling.  Skin:    General: Skin is warm.     Findings: No rash.     Comments: Bandage over left neck.  Neurological:     Mental Status: She is alert.     Comments: Patient unable to move left leg.  Weakness left arm.      Condition at discharge: fair  The results of significant diagnostics from this hospitalization (including imaging, microbiology,  ancillary and laboratory) are listed below for reference.   Imaging Studies: US Abdomen Limited RUQ (LIVER/GB)  Result Date: 08/26/2022 CLINICAL DATA:  Elevated LFTs EXAM: ULTRASOUND ABDOMEN LIMITED RIGHT UPPER QUADRANT COMPARISON:  None Available. FINDINGS: Gallbladder: No gallstones or wall thickening visualized. No sonographic Murphy sign noted by sonographer. 6 mm gallbladder polyp. No follow-up recommended. Common bile duct: Diameter: 3 mm.  No intrahepatic biliary dilation. Liver: No focal lesion identified. Increased parenchymal echogenicity. Portal vein is patent on color Doppler imaging with normal direction of blood flow towards the liver. Other: None. IMPRESSION: No acute abnormality.  Hepatic steatosis. Electronically Signed   By: Minerva Fester M.D.   On: 08/26/2022 21:38   CT PELVIS W CONTRAST  Result Date: 08/26/2022 CLINICAL DATA:  Soft tissue infection suspected. Decubitus ulcer, evaluate depth. EXAM: CT PELVIS WITH CONTRAST TECHNIQUE: Multidetector CT imaging of the pelvis was performed using the standard protocol following the bolus administration of intravenous contrast. RADIATION DOSE REDUCTION: This exam was performed according to the departmental dose-optimization program which includes automated exposure control, adjustment of the mA and/or kV according to patient size and/or use of iterative reconstruction technique. CONTRAST:  75mL OMNIPAQUE IOHEXOL 300 MG/ML  SOLN COMPARISON:  None Available. FINDINGS: Urinary Tract: No urinary bladder wall thickening. Moderate amount of air in the urinary bladder, likely iatrogenic, correlate with history of recent urinary catheterization. Bowel: Moderate amount of stool in the colon suggesting constipation. No bowel wall thickening or acute abnormality. Vascular/Lymphatic: No pathologically enlarged lymph nodes. No significant vascular abnormality seen. Reproductive: No mass or other significant abnormality. Normal uterus. Other: There is  marked skin thickening about the lower sacrum with a deep decubitus ulcer measuring at least 1.8 x 2.8 x 3.1 cm. Musculoskeletal: Deep decubitus ulcer about the lower sacrum/coccyx without evidence of cortical erosion or periosteal reaction to suggest osteomyelitis, evaluation of early osteomyelitis is however limited on CT examination. 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 No acute osseous abnormality. IMPRESSION: 1. Marked skin thickening about the lower sacrum/coccyx with a deep decubitus ulcer measuring at least 1.8 x 2.8 x 3.1 cm. No evidence of cortical erosion or  periosteal reaction to suggest osteomyelitis, evaluation of early osteomyelitis is however limited on CT examination. 2. Moderate amount of air in the urinary bladder without wall thickening, likely iatrogenic, correlate with history of recent urinary catheterization. Differential also includes urinary tract infection. 3. Moderate amount of stool in the colon suggesting constipation. 4. 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 Electronically Signed   By: Larose Hires D.O.   On: 08/26/2022 17:01   CT Soft Tissue Neck W Contrast  Result Date: 08/26/2022 CLINICAL DATA:  Soft tissue infection suspected, neck, xray done left cheek and neck swelling EXAM: CT NECK WITH CONTRAST TECHNIQUE: Multidetector CT imaging of the neck was performed using the standard protocol following the bolus administration of intravenous contrast. RADIATION DOSE REDUCTION: This exam was performed according to the departmental dose-optimization program which includes automated exposure control, adjustment of the mA and/or kV according to patient size and/or use of iterative reconstruction technique. CONTRAST:  75mL OMNIPAQUE IOHEXOL 300 MG/ML  SOLN COMPARISON:  None Available. FINDINGS: Pharynx and larynx: Epiglottis is normal in appearance. Bilateral tonsils are normal in  appearance. Salivary glands: Bilateral parotid glands are normal in appearance. The right submandibular gland is poorly visualized, slightly posteriorly displaced. Thyroid: Normal. Lymph nodes: None enlarged or abnormal density. Vascular: Negative. Limited intracranial: Negative. Visualized orbits: Negative. Mastoids and visualized paranasal sinuses: Clear. Skeleton: No acute or aggressive process. Upper chest: Ground-glass opacities in the right upper lobe are nonspecific and may be infectious or inflammatory nature. Right-sided chest port in place. Other: There is large rim enhancing 3.6 x 5.1 x 4.6 cm fluid collection centered around the left mandibular body. This likely arises from a periapical lucency and cortical breakthrough from a left mandibular molar (series 3, image 42). IMPRESSION: 1. Large odontogenic abscess measuring 3.6 x 5.1 x 4.6 cm along the left mandibular body. 2. Ground-glass opacities in the right upper lobe are nonspecific and may be infectious or inflammatory in nature. Electronically Signed   By: Lorenza Cambridge M.D.   On: 08/26/2022 16:46   DG Chest Port 1 View  Result Date: 08/26/2022 CLINICAL DATA:  Questionable sepsis - evaluate for abnormality EXAM: PORTABLE CHEST 1 VIEW COMPARISON:  CXR 11/11/13 FINDINGS: Right-sided chest port with tip near the cavoatrial junction. No pleural effusion. No pneumothorax. No focal airspace opacity. No radiographically apparent displaced rib fractures. Visualized upper abdomen is unremarkable. IMPRESSION: No focal airspace opacity Electronically Signed   By: Lorenza Cambridge M.D.   On: 08/26/2022 15:24    Microbiology: Results for orders placed or performed during the hospital encounter of 08/26/22  Resp panel by RT-PCR (RSV, Flu A&B, Covid) Anterior Nasal Swab     Status: None   Collection Time: 08/26/22  2:07 PM   Specimen: Anterior Nasal Swab  Result Value Ref Range Status   SARS Coronavirus 2 by RT PCR NEGATIVE NEGATIVE Final    Comment:  (NOTE) SARS-CoV-2 target nucleic acids are NOT DETECTED.  The SARS-CoV-2 RNA is generally detectable in upper respiratory specimens during the acute phase of infection. The lowest concentration of SARS-CoV-2 viral copies this assay can detect is 138 copies/mL. A negative result does not preclude SARS-Cov-2 infection and should not be used as the sole basis for treatment or other patient management decisions. A negative result may occur with  improper specimen collection/handling, submission of specimen other than nasopharyngeal swab, presence of viral mutation(s) within the areas targeted by this assay, and inadequate number of viral copies(<138 copies/mL).  A negative result must be combined with clinical observations, patient history, and epidemiological information. The expected result is Negative.  Fact Sheet for Patients:  BloggerCourse.com  Fact Sheet for Healthcare Providers:  SeriousBroker.it  This test is no t yet approved or cleared by the [place] FDA and  has been authorized for detection and/or diagnosis of SARS-CoV-2 by FDA under an Emergency Use Authorization (EUA). This EUA will remain  in effect (meaning this test can be used) for the duration of the COVID-19 declaration under Section 564(b)(1) of the Act, 21 U.S.C.section 360bbb-3(b)(1), unless the authorization is terminated  or revoked sooner.       Influenza A by PCR NEGATIVE NEGATIVE Final   Influenza B by PCR NEGATIVE NEGATIVE Final    Comment: (NOTE) The Xpert Xpress SARS-CoV-2/FLU/RSV plus assay is intended as an aid in the diagnosis of influenza from Nasopharyngeal swab specimens and should not be used as a sole basis for treatment. Nasal washings and aspirates are unacceptable for Xpert Xpress SARS-CoV-2/FLU/RSV testing.  Fact Sheet for Patients: BloggerCourse.com  Fact Sheet for Healthcare  Providers: SeriousBroker.it  This test is not yet approved or cleared by the [place] FDA and has been authorized for detection and/or diagnosis of SARS-CoV-2 by FDA under an Emergency Use Authorization (EUA). This EUA will remain in effect (meaning this test can be used) for the duration of the COVID-19 declaration under Section 564(b)(1) of the Act, 21 U.S.C. section 360bbb-3(b)(1), unless the authorization is terminated or revoked.     Resp Syncytial Virus by PCR NEGATIVE NEGATIVE Final    Comment: (NOTE) Fact Sheet for Patients: BloggerCourse.com  Fact Sheet for Healthcare Providers: SeriousBroker.it  This test is not yet approved or cleared by the [place] FDA and has been authorized for detection and/or diagnosis of SARS-CoV-2 by FDA under an Emergency Use Authorization (EUA). This EUA will remain in effect (meaning this test can be used) for the duration of the COVID-19 declaration under Section 564(b)(1) of the Act, 21 U.S.C. section 360bbb-3(b)(1), unless the authorization is terminated or revoked.  Performed at [facility], [address] [place], [place] [address]   Blood Culture (routine x 2)     Status: None (Preliminary result)   Collection Time: 08/26/22  2:31 PM   Specimen: BLOOD  Result Value Ref Range Status   Specimen Description BLOOD RIGHT ANTECUBITAL  Final   Special Requests   Final    BOTTLES DRAWN AEROBIC AND ANAEROBIC Blood Culture results may not be optimal due to an inadequate volume of blood received in culture bottles   Culture   Final    NO GROWTH 3 DAYS Performed at [facility], [address] [place], [place] [address]    Report Status PENDING  Incomplete  Blood Culture (routine x 2)     Status: None (Preliminary result)   Collection Time: 08/26/22  4:05 PM   Specimen: BLOOD  Result Value Ref Range Status   Specimen Description  BLOOD CENTRAL LINE  Final   Special Requests   Final    BOTTLES DRAWN AEROBIC AND ANAEROBIC Blood Culture results may not be optimal due to an excessive volume of blood received in culture bottles   Culture   Final    NO GROWTH 3 DAYS Performed at [facility], [address] [place], [place] [address]    Report Status PENDING  Incomplete  Aerobic/Anaerobic Culture w Gram Stain (surgical/deep wound)     Status: None (Preliminary result)  Collection Time: 08/27/22  7:40 AM   Specimen: Path fluid; Body Fluid  Result Value Ref Range Status   Specimen Description   Final    FLUID Performed at [facility], [address] [place], [place] [address]    Special Requests   Final    NONE Performed at [facility], [address] [place], [place] [address]    Gram Stain   Final    NO WBC SEEN MODERATE GRAM POSITIVE COCCI IN PAIRS IN CHAINS RARE GRAM VARIABLE ROD Performed at [facility], [address] [place], [place] [address]    Culture   Final    MODERATE STREPTOCOCCUS INTERMEDIUS NO ANAEROBES ISOLATED; CULTURE IN PROGRESS FOR 5 DAYS    Report Status PENDING  Incomplete   Organism ID, Bacteria STREPTOCOCCUS INTERMEDIUS  Final      Susceptibility   Streptococcus intermedius - MIC*    PENICILLIN <=0.06 SENSITIVE Sensitive     CEFTRIAXONE <=0.12 SENSITIVE Sensitive     ERYTHROMYCIN <=0.12 SENSITIVE Sensitive     LEVOFLOXACIN 0.5 SENSITIVE Sensitive     VANCOMYCIN 0.25 SENSITIVE Sensitive     * MODERATE STREPTOCOCCUS INTERMEDIUS    Labs: CBC: Recent Labs  Lab 08/26/22 1305 08/26/22 2155 08/28/22 0436  WBC 22.8* 22.9* 12.8*  NEUTROABS 20.4*  --   --   HGB 14.4 11.8* 10.6*  HCT 41.8 36.1 32.0*  MCV 78.7* 83.6 81.4  PLT 589* 435* 417*   Basic Metabolic Panel: Recent Labs  Lab 08/26/22 1305 08/26/22 2155 08/28/22 0436  NA 132*  --  138  K 3.1*  --  3.2*  CL 93*  --  103  CO2 25  --  26  GLUCOSE 171*  --  156*  BUN 10  --   12  CREATININE 0.41* 0.38* <0.30*  CALCIUM 8.6*  --  7.9*   Liver Function Tests: Recent Labs  Lab 08/26/22 1431  AST 147*  ALT 75*  ALKPHOS 169*  BILITOT 1.2  PROT 6.7  ALBUMIN 3.0*   CBG: Recent Labs  Lab 08/27/22 1348  GLUCAP 158*    Discharge time spent: greater than 30 minutes.  Signed: Alford Highland, MD [facility] 08/29/2022

## 2022-08-30 LAB — CULTURE, BLOOD (ROUTINE X 2): Culture: NO GROWTH

## 2022-08-31 LAB — CULTURE, BLOOD (ROUTINE X 2): Culture: NO GROWTH

## 2022-09-02 LAB — AEROBIC/ANAEROBIC CULTURE W GRAM STAIN (SURGICAL/DEEP WOUND): Gram Stain: NONE SEEN

## 2022-09-11 ENCOUNTER — Other Ambulatory Visit: Payer: Self-pay | Admitting: Internal Medicine

## 2022-09-11 DIAGNOSIS — R7989 Other specified abnormal findings of blood chemistry: Secondary | ICD-10-CM

## 2022-09-17 ENCOUNTER — Inpatient Hospital Stay: Payer: BC Managed Care – PPO

## 2022-09-17 ENCOUNTER — Ambulatory Visit: Payer: Medicare PPO

## 2022-09-26 ENCOUNTER — Inpatient Hospital Stay: Payer: Medicare PPO

## 2022-10-06 ENCOUNTER — Encounter: Payer: Self-pay | Admitting: Nurse Practitioner

## 2022-11-11 DEATH — deceased

## 2022-11-21 ENCOUNTER — Inpatient Hospital Stay: Payer: Medicare PPO

## 2023-01-16 ENCOUNTER — Inpatient Hospital Stay: Payer: Medicare PPO

## 2023-01-29 ENCOUNTER — Telehealth: Payer: Self-pay | Admitting: Nurse Practitioner

## 2023-01-30 ENCOUNTER — Telehealth: Payer: Medicare Other | Admitting: Nurse Practitioner

## 2023-03-13 ENCOUNTER — Inpatient Hospital Stay: Payer: Medicare PPO

## 2023-05-08 ENCOUNTER — Inpatient Hospital Stay: Payer: Medicare PPO

## 2023-06-10 IMAGING — MG MM DIGITAL SCREENING BILAT W/ TOMO AND CAD
6 of 10 series · 6 of 30 positions shown · non-contrast
Comparison: Previous exam(s).

CLINICAL DATA: Screening.

EXAM:
DIGITAL SCREENING BILATERAL MAMMOGRAM WITH TOMOSYNTHESIS AND CAD
TECHNIQUE: Bilateral screening digital craniocaudal and mediolateral oblique
mammograms were obtained. Bilateral screening digital breast
tomosynthesis was performed. The images were evaluated with
computer-aided detection.

[R CC synth-2D]
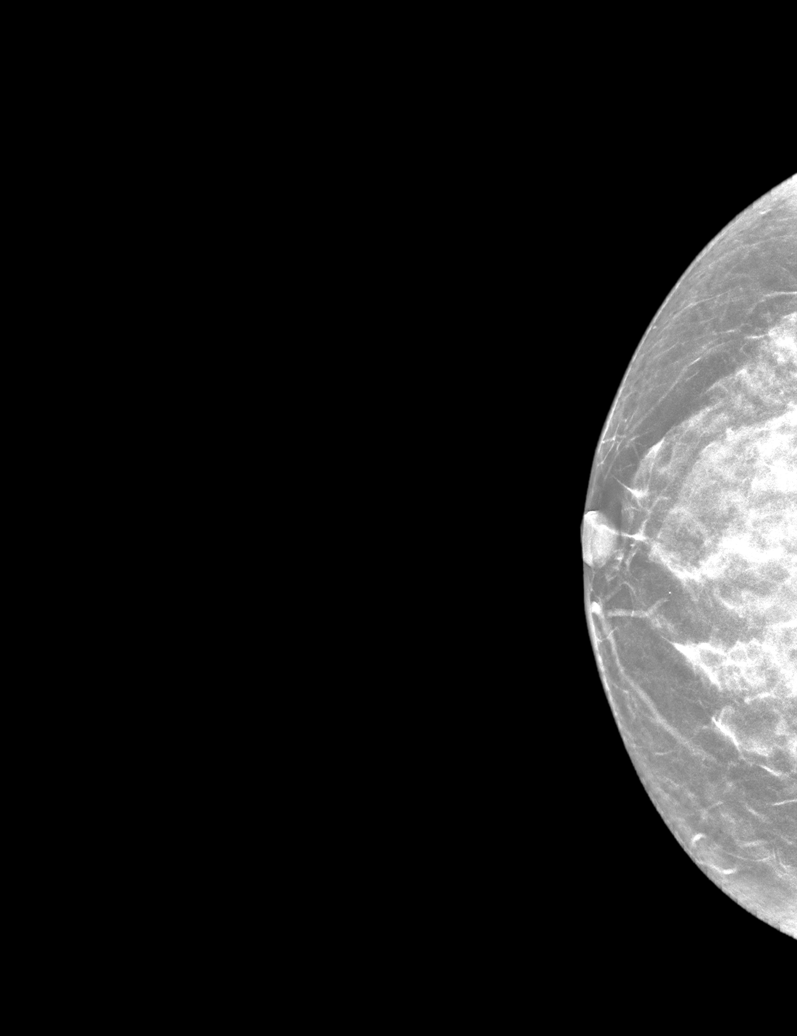

[L MLO synth-2D]
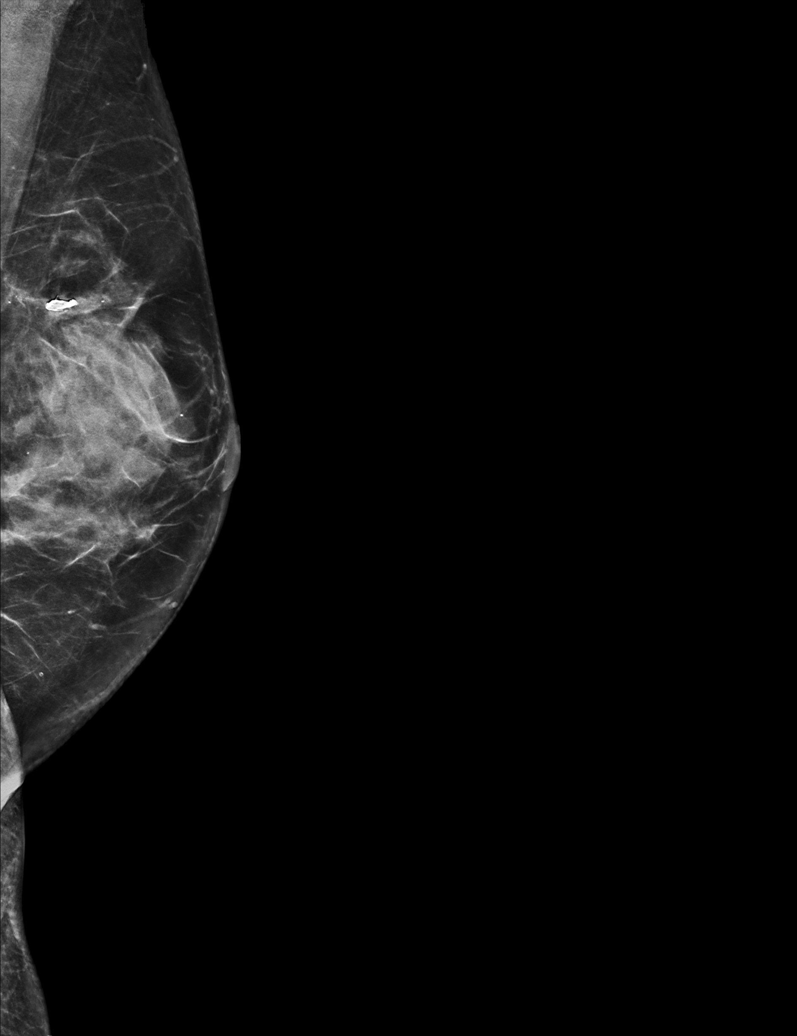

[R MLO synth-2D]
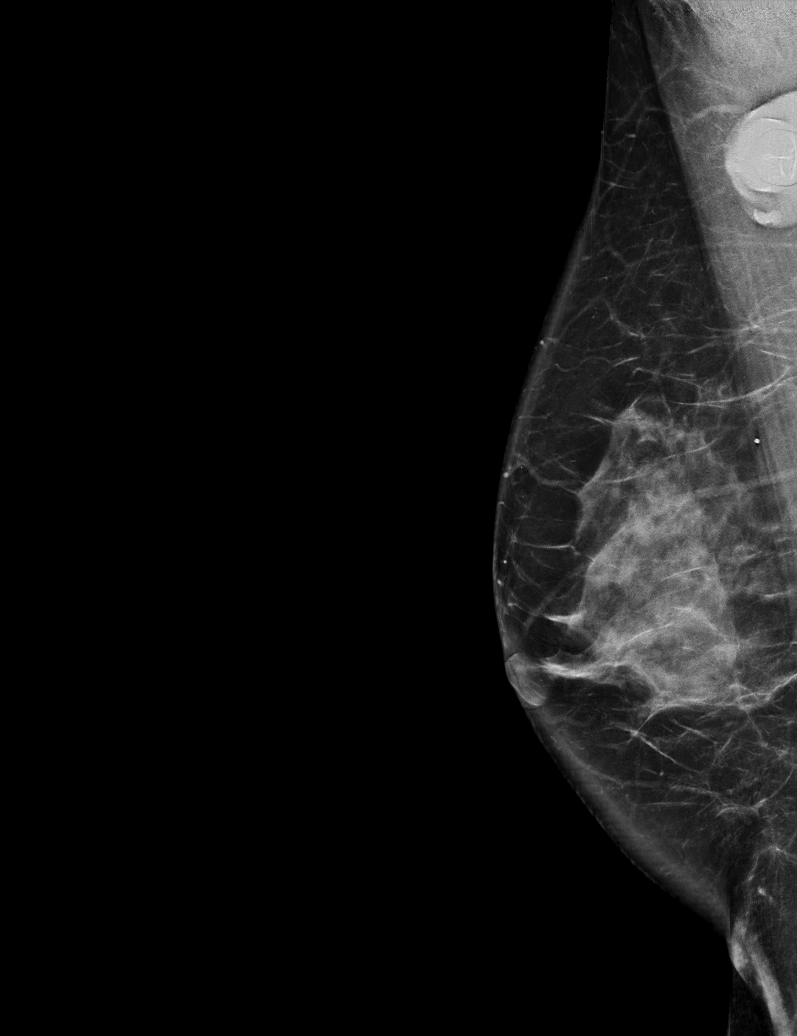

[L XCCL synth-2D]
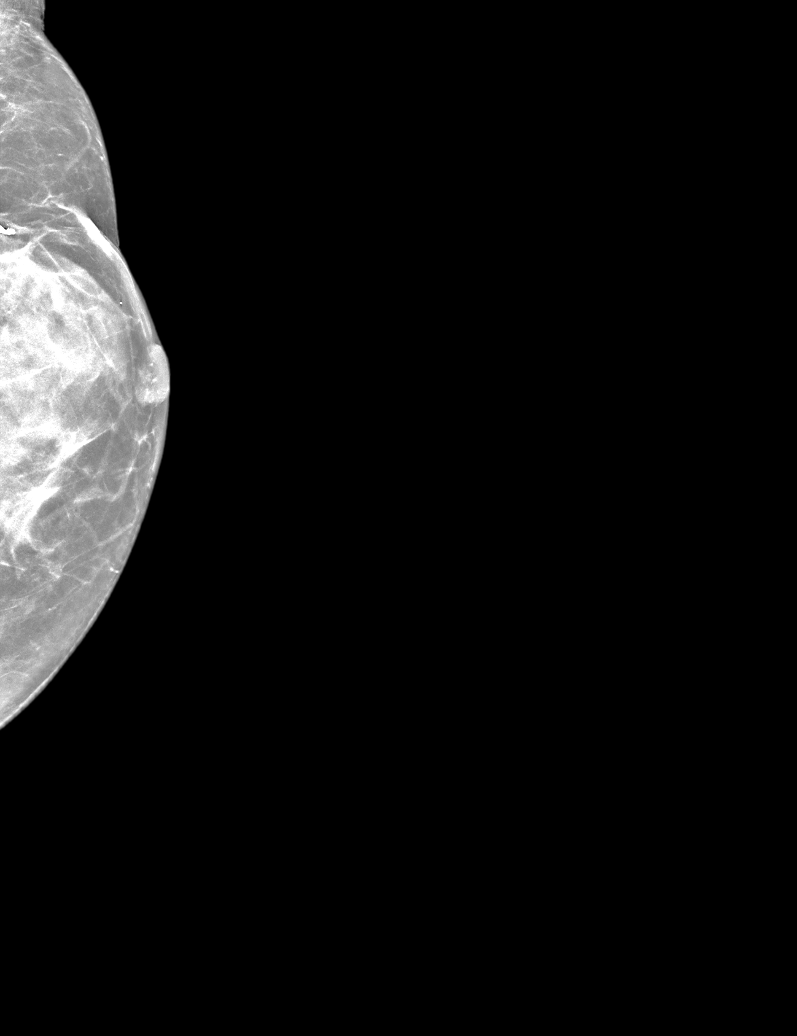

[L CC synth-2D]
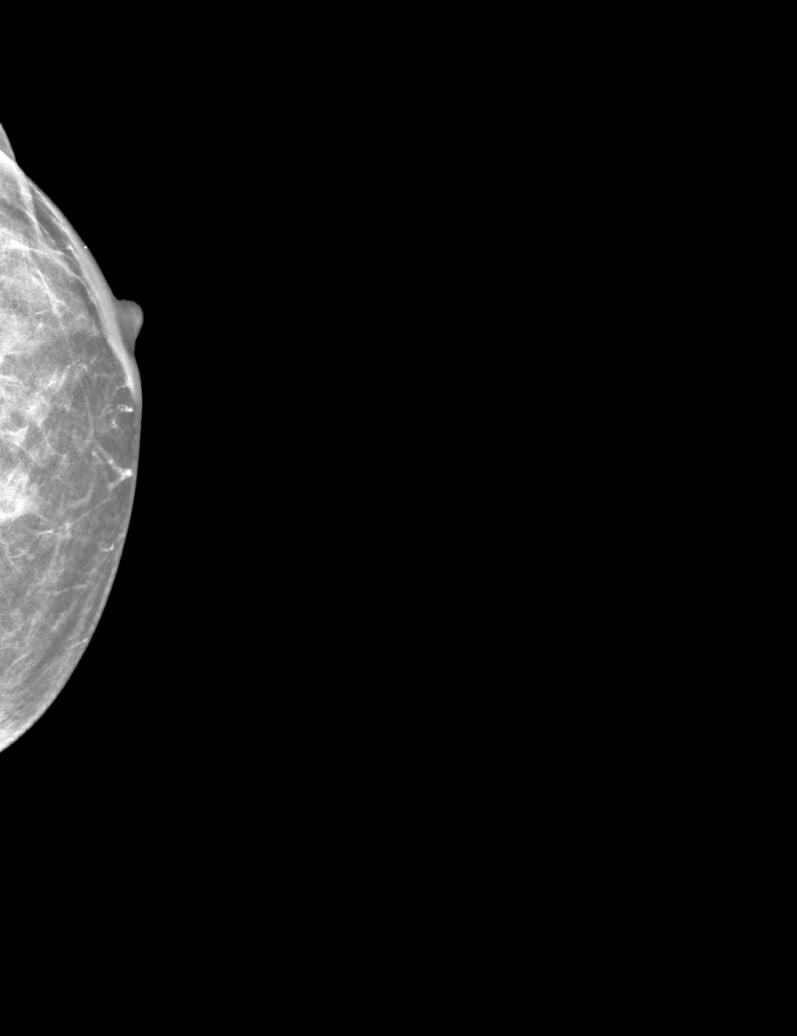

[L XCCL tomo · tomo slice 27/52.0]
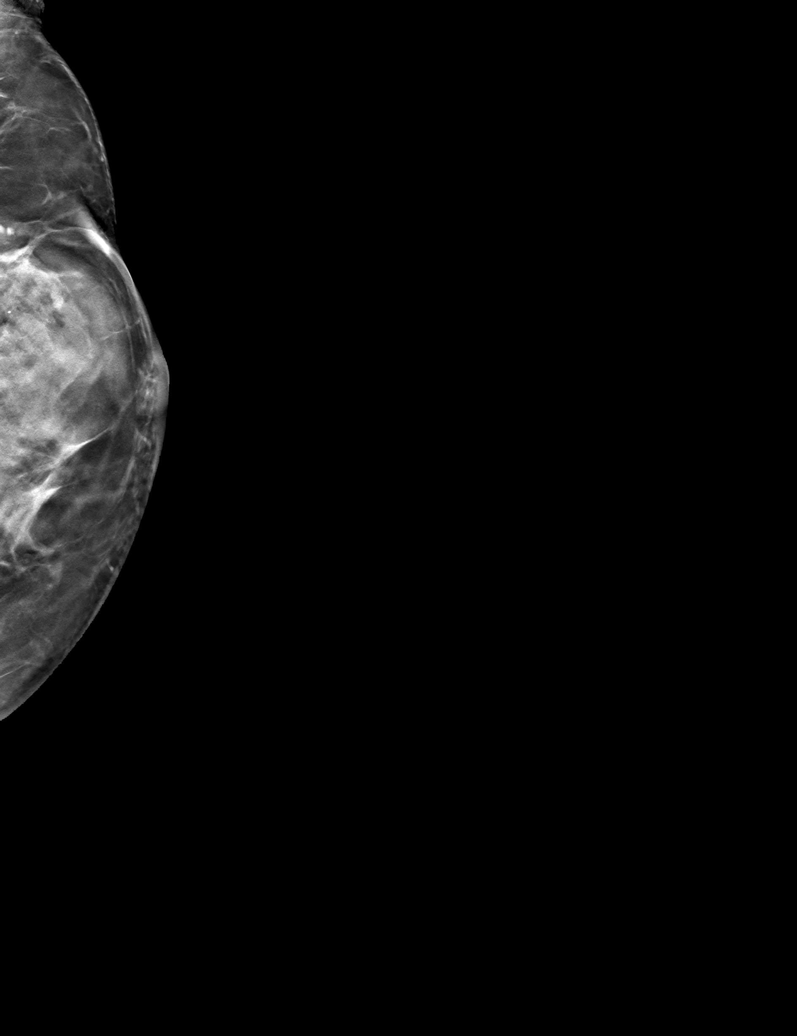

[6 of 30 positions shown; findings below may reference images not displayed]

ACR Breast Density Category c: The breast tissue is heterogeneously
dense, which may obscure small masses.
FINDINGS: There are no findings suspicious for malignancy.
IMPRESSION: No mammographic evidence of malignancy. A result letter of this
screening mammogram will be mailed directly to the patient.

RECOMMENDATION:
Screening mammogram in one year. (Code:Q3-W-BC3)

BI-RADS CATEGORY  1: Negative.

## 2023-07-03 ENCOUNTER — Inpatient Hospital Stay: Payer: Medicare PPO

## 2023-08-28 ENCOUNTER — Inpatient Hospital Stay: Payer: Medicare PPO

## 2023-10-23 ENCOUNTER — Inpatient Hospital Stay: Payer: Medicare PPO

## 2023-12-18 ENCOUNTER — Inpatient Hospital Stay: Payer: Medicare PPO

## 2024-02-12 ENCOUNTER — Inpatient Hospital Stay: Payer: Medicare PPO
